# Patient Record
Sex: Male | Born: 1935 | Race: Black or African American | Hispanic: No | Smoking: Former smoker
Health system: Southern US, Community
[De-identification: ages and names within clinical notes are randomized; demographics above are authoritative.]

## PROBLEM LIST (undated history)

## (undated) DIAGNOSIS — M1710 Unilateral primary osteoarthritis, unspecified knee: Secondary | ICD-10-CM

## (undated) DIAGNOSIS — G4733 Obstructive sleep apnea (adult) (pediatric): Secondary | ICD-10-CM

## (undated) DIAGNOSIS — R413 Other amnesia: Secondary | ICD-10-CM

## (undated) DIAGNOSIS — M179 Osteoarthritis of knee, unspecified: Secondary | ICD-10-CM

## (undated) DIAGNOSIS — H9190 Unspecified hearing loss, unspecified ear: Secondary | ICD-10-CM

## (undated) DIAGNOSIS — M4800 Spinal stenosis, site unspecified: Secondary | ICD-10-CM

## (undated) DIAGNOSIS — IMO0001 Reserved for inherently not codable concepts without codable children: Secondary | ICD-10-CM

## (undated) DIAGNOSIS — S0990XA Unspecified injury of head, initial encounter: Secondary | ICD-10-CM

## (undated) DIAGNOSIS — M1990 Unspecified osteoarthritis, unspecified site: Secondary | ICD-10-CM

## (undated) DIAGNOSIS — E1165 Type 2 diabetes mellitus with hyperglycemia: Secondary | ICD-10-CM

## (undated) DIAGNOSIS — I10 Essential (primary) hypertension: Secondary | ICD-10-CM

## (undated) DIAGNOSIS — E785 Hyperlipidemia, unspecified: Secondary | ICD-10-CM

## (undated) DIAGNOSIS — R609 Edema, unspecified: Secondary | ICD-10-CM

## (undated) DIAGNOSIS — B356 Tinea cruris: Secondary | ICD-10-CM

## (undated) HISTORY — DX: Unspecified injury of head, initial encounter: S09.90XA

## (undated) HISTORY — DX: Edema, unspecified: R60.9

## (undated) HISTORY — PX: RETINAL DETACHMENT SURGERY: SHX105

## (undated) HISTORY — DX: Other amnesia: R41.3

## (undated) HISTORY — DX: Tinea cruris: B35.6

## (undated) HISTORY — DX: Obstructive sleep apnea (adult) (pediatric): G47.33

## (undated) HISTORY — DX: Essential (primary) hypertension: I10

## (undated) HISTORY — DX: Spinal stenosis, site unspecified: M48.00

## (undated) HISTORY — DX: Unilateral primary osteoarthritis, unspecified knee: M17.10

## (undated) HISTORY — DX: Reserved for inherently not codable concepts without codable children: IMO0001

## (undated) HISTORY — DX: Type 2 diabetes mellitus with hyperglycemia: E11.65

## (undated) HISTORY — DX: Unspecified hearing loss, unspecified ear: H91.90

## (undated) HISTORY — DX: Osteoarthritis of knee, unspecified: M17.9

## (undated) HISTORY — DX: Unspecified osteoarthritis, unspecified site: M19.90

## (undated) HISTORY — DX: Hyperlipidemia, unspecified: E78.5

---

## 1968-04-13 HISTORY — PX: FACIAL RECONSTRUCTION SURGERY: SHX631

## 1997-10-02 ENCOUNTER — Other Ambulatory Visit: Admission: RE | Admit: 1997-10-02 | Discharge: 1997-10-02 | Payer: Self-pay | Admitting: Internal Medicine

## 1998-03-28 ENCOUNTER — Ambulatory Visit (HOSPITAL_COMMUNITY): Admission: RE | Admit: 1998-03-28 | Discharge: 1998-03-28 | Payer: Self-pay | Admitting: Internal Medicine

## 1998-04-01 ENCOUNTER — Encounter: Payer: Self-pay | Admitting: Internal Medicine

## 1998-04-01 ENCOUNTER — Ambulatory Visit (HOSPITAL_COMMUNITY): Admission: RE | Admit: 1998-04-01 | Discharge: 1998-04-01 | Payer: Self-pay | Admitting: Internal Medicine

## 1999-11-04 ENCOUNTER — Encounter: Payer: Self-pay | Admitting: Internal Medicine

## 1999-11-05 ENCOUNTER — Inpatient Hospital Stay (HOSPITAL_COMMUNITY): Admission: EM | Admit: 1999-11-05 | Discharge: 1999-11-07 | Payer: Self-pay | Admitting: *Deleted

## 1999-11-06 ENCOUNTER — Encounter: Payer: Self-pay | Admitting: Internal Medicine

## 2001-07-26 ENCOUNTER — Encounter: Payer: Self-pay | Admitting: Ophthalmology

## 2001-07-27 ENCOUNTER — Inpatient Hospital Stay (HOSPITAL_COMMUNITY): Admission: RE | Admit: 2001-07-27 | Discharge: 2001-07-29 | Payer: Self-pay | Admitting: Ophthalmology

## 2001-07-28 ENCOUNTER — Encounter: Payer: Self-pay | Admitting: Ophthalmology

## 2002-06-06 ENCOUNTER — Ambulatory Visit (HOSPITAL_COMMUNITY): Admission: RE | Admit: 2002-06-06 | Discharge: 2002-06-06 | Payer: Self-pay | Admitting: Ophthalmology

## 2002-06-16 ENCOUNTER — Ambulatory Visit (HOSPITAL_COMMUNITY): Admission: RE | Admit: 2002-06-16 | Discharge: 2002-06-17 | Payer: Self-pay | Admitting: Ophthalmology

## 2002-06-16 ENCOUNTER — Encounter: Payer: Self-pay | Admitting: Ophthalmology

## 2003-07-20 ENCOUNTER — Ambulatory Visit (HOSPITAL_BASED_OUTPATIENT_CLINIC_OR_DEPARTMENT_OTHER): Admission: RE | Admit: 2003-07-20 | Discharge: 2003-07-20 | Payer: Self-pay | Admitting: Internal Medicine

## 2003-11-09 ENCOUNTER — Encounter: Admission: RE | Admit: 2003-11-09 | Discharge: 2003-11-09 | Payer: Self-pay | Admitting: Internal Medicine

## 2004-02-29 ENCOUNTER — Ambulatory Visit (HOSPITAL_COMMUNITY): Admission: RE | Admit: 2004-02-29 | Discharge: 2004-02-29 | Payer: Self-pay | Admitting: Cardiology

## 2004-03-09 LAB — HM COLONOSCOPY

## 2004-11-12 ENCOUNTER — Ambulatory Visit (HOSPITAL_COMMUNITY): Admission: RE | Admit: 2004-11-12 | Discharge: 2004-11-12 | Payer: Self-pay | Admitting: Pulmonary Disease

## 2005-01-01 ENCOUNTER — Ambulatory Visit (HOSPITAL_COMMUNITY): Admission: RE | Admit: 2005-01-01 | Discharge: 2005-01-01 | Payer: Self-pay | Admitting: Cardiology

## 2005-01-15 ENCOUNTER — Ambulatory Visit (HOSPITAL_COMMUNITY): Admission: RE | Admit: 2005-01-15 | Discharge: 2005-01-15 | Payer: Self-pay | Admitting: Internal Medicine

## 2005-07-14 ENCOUNTER — Encounter: Admission: RE | Admit: 2005-07-14 | Discharge: 2005-07-14 | Payer: Self-pay | Admitting: Internal Medicine

## 2006-03-15 ENCOUNTER — Encounter: Admission: RE | Admit: 2006-03-15 | Discharge: 2006-03-15 | Payer: Self-pay | Admitting: Internal Medicine

## 2006-12-09 ENCOUNTER — Ambulatory Visit: Payer: Self-pay | Admitting: Internal Medicine

## 2006-12-28 ENCOUNTER — Ambulatory Visit (HOSPITAL_BASED_OUTPATIENT_CLINIC_OR_DEPARTMENT_OTHER): Admission: RE | Admit: 2006-12-28 | Discharge: 2006-12-28 | Payer: Self-pay | Admitting: Internal Medicine

## 2007-01-09 ENCOUNTER — Ambulatory Visit: Payer: Self-pay | Admitting: Internal Medicine

## 2007-02-11 ENCOUNTER — Ambulatory Visit: Payer: Self-pay | Admitting: Internal Medicine

## 2007-08-18 ENCOUNTER — Emergency Department (HOSPITAL_COMMUNITY): Admission: EM | Admit: 2007-08-18 | Discharge: 2007-08-18 | Payer: Self-pay | Admitting: Emergency Medicine

## 2007-08-29 ENCOUNTER — Encounter (HOSPITAL_COMMUNITY): Admission: RE | Admit: 2007-08-29 | Discharge: 2007-09-28 | Payer: Self-pay | Admitting: Chiropractic Medicine

## 2007-10-03 ENCOUNTER — Encounter (HOSPITAL_COMMUNITY): Admission: RE | Admit: 2007-10-03 | Discharge: 2007-11-02 | Payer: Self-pay | Admitting: Chiropractic Medicine

## 2008-02-09 DIAGNOSIS — E119 Type 2 diabetes mellitus without complications: Secondary | ICD-10-CM | POA: Insufficient documentation

## 2008-02-09 DIAGNOSIS — G4733 Obstructive sleep apnea (adult) (pediatric): Secondary | ICD-10-CM | POA: Insufficient documentation

## 2008-02-09 DIAGNOSIS — J310 Chronic rhinitis: Secondary | ICD-10-CM | POA: Insufficient documentation

## 2008-02-10 ENCOUNTER — Ambulatory Visit: Payer: Self-pay | Admitting: Internal Medicine

## 2008-02-10 DIAGNOSIS — J4520 Mild intermittent asthma, uncomplicated: Secondary | ICD-10-CM | POA: Insufficient documentation

## 2008-09-04 ENCOUNTER — Encounter (INDEPENDENT_AMBULATORY_CARE_PROVIDER_SITE_OTHER): Payer: Self-pay | Admitting: *Deleted

## 2008-10-25 ENCOUNTER — Telehealth: Payer: Self-pay | Admitting: Internal Medicine

## 2008-12-20 ENCOUNTER — Ambulatory Visit: Payer: Self-pay | Admitting: Internal Medicine

## 2008-12-20 DIAGNOSIS — J029 Acute pharyngitis, unspecified: Secondary | ICD-10-CM | POA: Insufficient documentation

## 2009-02-01 ENCOUNTER — Encounter: Admission: RE | Admit: 2009-02-01 | Discharge: 2009-02-01 | Payer: Self-pay | Admitting: Internal Medicine

## 2009-07-11 ENCOUNTER — Encounter: Admission: RE | Admit: 2009-07-11 | Discharge: 2009-07-11 | Payer: Self-pay | Admitting: Internal Medicine

## 2009-09-02 ENCOUNTER — Encounter: Payer: Self-pay | Admitting: Internal Medicine

## 2009-09-17 ENCOUNTER — Encounter: Payer: Self-pay | Admitting: Internal Medicine

## 2010-02-18 ENCOUNTER — Ambulatory Visit: Payer: Self-pay | Admitting: Internal Medicine

## 2010-05-03 ENCOUNTER — Encounter: Payer: Self-pay | Admitting: Cardiology
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
## 2010-05-13 NOTE — Miscellaneous (Signed)
Summary: Request Medical Records/[facility]  Request Medical Records/[facility]   Imported By: Phillis Knack 09/06/2009 14:26:19  _____________________________________________________________________  External Attachment:    Type:   Image     Comment:   External Document

## 2010-05-13 NOTE — Letter (Signed)
Summary: [facility]  [facility]   Imported By: Bubba Hales 09/20/2009 09:36:41  _____________________________________________________________________  External Attachment:    Type:   Image     Comment:   External Document

## 2010-05-19 ENCOUNTER — Other Ambulatory Visit (HOSPITAL_COMMUNITY): Payer: Self-pay | Admitting: Cardiology

## 2010-05-30 ENCOUNTER — Other Ambulatory Visit: Payer: Self-pay | Admitting: Internal Medicine

## 2010-05-30 DIAGNOSIS — R52 Pain, unspecified: Secondary | ICD-10-CM

## 2010-05-30 DIAGNOSIS — R609 Edema, unspecified: Secondary | ICD-10-CM

## 2010-06-02 ENCOUNTER — Ambulatory Visit
Admission: RE | Admit: 2010-06-02 | Discharge: 2010-06-02 | Disposition: A | Discharge status: Home / Self Care | Payer: BC Managed Care – PPO | Source: Ambulatory Visit | Attending: Internal Medicine | Admitting: Internal Medicine

## 2010-06-02 DIAGNOSIS — R52 Pain, unspecified: Secondary | ICD-10-CM

## 2010-06-02 DIAGNOSIS — R609 Edema, unspecified: Secondary | ICD-10-CM

## 2010-06-06 ENCOUNTER — Ambulatory Visit (HOSPITAL_COMMUNITY): Payer: Self-pay

## 2010-06-06 ENCOUNTER — Ambulatory Visit (HOSPITAL_COMMUNITY)
Admission: RE | Admit: 2010-06-06 | Discharge: 2010-06-06 | Disposition: A | Discharge status: Home / Self Care | Payer: Medicare Other | Source: Ambulatory Visit | Attending: Cardiology | Admitting: Cardiology

## 2010-06-06 DIAGNOSIS — R079 Chest pain, unspecified: Secondary | ICD-10-CM | POA: Insufficient documentation

## 2010-06-06 MED ORDER — TECHNETIUM TC 99M TETROFOSMIN IV KIT: 10.0000 | PACK | Freq: Once | INTRAVENOUS | Status: AC | PRN

## 2010-06-06 MED ORDER — TECHNETIUM TC 99M TETROFOSMIN IV KIT: 30.0000 | PACK | Freq: Once | INTRAVENOUS | Status: AC | PRN

## 2010-07-30 ENCOUNTER — Encounter: Payer: Self-pay | Admitting: Internal Medicine

## 2010-08-26 NOTE — Assessment & Plan Note (Signed)
[facility]                             PULMONARY OFFICE NOTE   NAME:BELLMorgen, Clendenen                         MRN:          [id number]  DATE:02/11/2007                            DOB:          Dec 04, 1934    PROBLEM:  1. Obstructive sleep apnea.  2. Allergic rhinitis (Dr. Donneta Romberg).  3. Diabetes.   HISTORY:  He had not been tolerating CPAP with primary complaint being  his wife's frustration with his snoring.  He returns now after his  latest sleep study on December 28, 2006 showing unremarkable sleep  architecture and minimal sleep apnea with an index of 5.4 (normal range  is 0-5).  He did have some bruxism.  His original study around 1992 had  shown an index of 50 per hour and the repeat in 2005 had shown an index  of 47, titrated to a CPAP of 9 at that time.  It is not clear why he  demonstrated so little apnea by comparison on the current study.  If  anything he has gained some weight but thinks he may be breathing better  through his nose.  He has disliked CPAP because it caused rapid nasal  congestion consistent with a vasomotor rhinitis and that may vary from  study to study.  Has had flu vaccine.   MEDICATION LIST:  Reviewed as charted.   OBJECTIVE:  Weight 233 pounds, blood pressure 136/64, pulse 83, room air  saturation 98%.  Palatal spacing 3/4, nasal airway is not obstructed  now.  Heart sounds are regular, breathing unlabored.  He seems calm and  alert.   IMPRESSION:  Obstructive sleep apnea now minimal with an index of 5.4,  prior index 50 and 47.   PLAN:  I have suggested he try conservative measures to manage snoring  including sleeping off the flat of his back, nasal strips, chin strap,  possible occasional use of decongestants and he is going to have his  CPAP machine reduced from 9 to 5 CWP since that will likely take care of  the snoring and be better tolerated by him.  Schedule return in 1 year,  earlier p.r.n.     Clinton  D. Annamaria Boots, MD, [place] Flood, [place]  Electronically Signed    CDY/MedQ  DD: 02/13/2007  DT: 02/14/2007  Job #: [id number]   cc:   Randall Hiss L. Marlou Sa, M.D.  Mosetta Anis, M.D. [facility]

## 2010-08-26 NOTE — Procedures (Signed)
NAMESAKETH, [name]                  ACCOUNT NO.:  [id number]   MEDICAL RECORD NO.:  [id number]          PATIENT TYPE:  OUT   LOCATION:  SLEEP CENTER                 FACILITY:  [facility]   PHYSICIAN:  Clinton D. Annamaria Boots, MD, FCCP, FACPDATE OF BIRTH:  02-18-37   DATE OF STUDY:  12/28/2006                            NOCTURNAL POLYSOMNOGRAM   REFERRING PHYSICIAN:  Clinton D. Young, MD, FCCP, FACP   INDICATION FOR STUDY:  Hypersomnia with sleep apnea.   EPWORTH SLEEPINESS SCORE:  6/24, BMI 27.9, weight 225 pounds.   MEDICATIONS:  Home medications are listed and reviewed.   SLEEP ARCHITECTURE:  Total sleep time 334 minutes with sleep efficiency  85%.  Stage 1 was 5%, stage II 74%, stage III is 1%, REM 20% of total  sleep time.  Sleep latency 4 minutes, REM latency 80 minutes, awake  after sleep onset 56 minutes, arousal index 14.7.  Ramipril, Singulair,  Crestor, Uroxatral, pentoxifylline and Actoplus Met were taken at  bedtime.   RESPIRATORY DATA:  Apnea-hypopnea index (AHI, RDI) 5.4 obstructive  events per hour (normal range 0-5 per hour), indicating minimal  obstructive sleep apnea/hypopnea syndrome.  This reflected one central  apnea and 29 hypopneas.  Most events were recorded while sleeping  supine.  There were insufficient events to permit CPAP titration by  split protocol on this study night.   OXYGEN DATA:  Moderately loud snoring with oxygen desaturation to a  nadir of 90%.  Mean oxygen saturation through the study was 94% on room  air.   CARDIAC DATA:  Sinus rhythm with occasional PVC and PAC.   MOVEMENT-PARASOMNIA:  No significant movement disturbance.  Bruxism.  Bathroom x2.   IMPRESSIONS-RECOMMENDATIONS:  1. Unremarkable sleep architecture for sleep center environment.  2. Minimal obstructive sleep apnea/hypopnea syndrome, barely abnormal,      apnea-hypopnea index 5.4 per hour      (normal range 0 to 5 per hour).  Moderate snoring with oxygen      desaturation nadir  90%.  Events were somewhat more common while      supine.  Bruxism was noted.      Clinton D. Annamaria Boots, MD, FCCP, FACP  Diplomate, Tax adviser of Sleep Medicine  Electronically Signed     CDY/MEDQ  D:  01/09/2007 10:01:08  T:  01/09/2007 12:45:25  Job:  [id number]

## 2010-08-26 NOTE — Assessment & Plan Note (Signed)
[facility]                             PULMONARY OFFICE NOTE   NAME:BELLFatih, Serratore                         MRN:          [id number]  DATE:12/09/2006                            DOB:          09-19-1934    PROBLEM LIST:  1. Obstructive sleep apnea.  2. Allergic rhinitis (Dr. Donneta Romberg).  3. Diabetes.   HISTORY:  I had followed this gentleman at the old office where he was  last seen in April 2005 for problems with obstructive sleep apnea.  He  had a sleep study on July 20, 2003, with an index of 47 per hour.  His  original diagnosis in 1992 had shown 50 per hour.  He had oxygen  desaturation and loud snoring.  CPAP was titrated to 9 CWP but had never  been particularly comfortable with it.  He says now that he quit CPAP  several months ago, blaming it for increasing sinus congestion and  saying he had been taking the mask off at night because of sneezing.  His wife had been a smoker, and he wanted to get an air cleaner for the  bedroom because of that.  I do not know if she still does, but she  complains that his snoring off of CPAP was much worse.  He has been  trying to sleep off his back.  He denies significant nasal congestion  when he is not wearing CPAP.  He has been using Astelin nasal spray in  the mornings.   MEDICATIONS:  1. Ramipril 5 mg.  2. Avodart 0.5 mg.  3. Singulair 10 mg.  4. Crestor 10 mg.  5. Uroxatral 10 mg.  6. Pentoxifylline 400 mg 3 times a day.  7. ACTOplus MET 15-850.  8. Januvia 10 mg.  9. Advair 250/50.  10.Astelin nasal spray.  11.Aspirin 81 mg.  12.Claritin.  13.Occasional alprazolam 0.5 mg.   DRUG INTOLERANCE:  SULFA.   OBJECTIVE:  VITAL SIGNS:  Weight 225 pounds which is about the same as  he had been weighing.  Blood pressure 122/70, pulse 79, room air  saturation 99%.  HEENT:  Turbinate edema.  Dental repair with partial plates.  GENERAL:  He yawns some, calm affect.   IMPRESSION:  Obstructive sleep  apnea, not tolerating CPAP very well  still.  He has allergic rhinitis and may be sensitive to the vasomotor  stimulation from the air flow despite humidifier.  His partial plates  make him a poor candidate for an oral appliance, and his throat is not  so bad as to make it likely that surgical repair would make a big  difference.   PLAN:  We reviewed the physiology of sleep apnea and treatment options  again with the decision to go forward with a repeat sleep study to  reestablish his current status and relook at best pressures using split  protocol.  He will return after that is completed.     Clinton D. Annamaria Boots, MD, [facility], FACP  Electronically Signed    CDY/MedQ  DD: 12/13/2006  DT: 12/13/2006  Job #: [id number]  cc:   Eric L. Marlou Sa, M.D.  Mosetta Anis, M.D. [facility]  [facility]

## 2010-08-29 NOTE — Op Note (Signed)
[facility]  Patient:    [name], [name] Visit Number: [id number] MRN: [id number]          Service Type: SUR Location: [id number] Attending Physician:  Charlette Caffey Dictated by:   Garey Ham, M.D. Proc. Date: 07/26/01 Admit Date:  07/26/2001   CC:         Randall Hiss L. Marlou Sa, M.D.  Marcene Corning., M.D.   Operative Report  PREOPERATIVE DIAGNOSIS:  Vitreous hemorrhage and rhegmatogenous retinal detachment, left eye.  POSTOPERATIVE DIAGNOSIS:  Vitreous hemorrhage and rhegmatogenous retinal detachment, left eye.  OPERATION:  Posterior vitrectomy through pars plana using vitreous infusion suction cutter, scleral buckling using solid silicone implants [id number] and [id number], diathermy application, vitreous-Perfluoron exchange, Perfluoron-air exchange.  SURGEON:  Garey Ham, M.D.  ASSISTANT:  Nurse.  ANESTHESIA:  General.  OPHTHALMOSCOPY:  As previously described.  DESCRIPTION OF PROCEDURE:  After the patient was prepped and draped, lid traction sutures were placed in the left upper and lower lids.  A peritomy was performed adjacent to the limbus 360 degrees.  Subconjunctival tissue was cleaned and the rectus muscles isolated with 4-0 silk traction sutures.  The sclera was inspected and felt to be in satisfactory condition for lamellar scleral dissection.  Localization was then carried out with the indirect ophthalmoscope and the large horseshoe tear noted at the 12:30 position.  No other tears were seen on indirect ophthalmoscopy and scleral depression.  It was elected to perform lamellar scleral dissection from the 10:30 to 2:30 position, the bed measuring 11 mm in width.  Diathermy applications were applied to the inner scleral lamella.  A trimmed 123456 solid silicone implant was placed in the bed and the flaps closed loosely with four 4-0 green Mersilene sutures with 3-0 plain catgut reinforcements.  A 123456 solid  silicone encircling band was placed about the globe, tied with two sutures at the 5 oclock position; anchoring sutures were placed at the 5 and 8 oclock position to hold the encircling band in place.  Due to the dense vitreous hemorrhage, it was elected to assemble the vitreous surgery instruments and ocular microscope.  Three sclerotomy sites were then prepared at the 10, 2 and 4 oclock position, 3.5 mm from the limbus using the MVR blade.  The 4-mm vitreous infusion terminal was secured in place at the 4 oclock position with a 5-0 white mattress Dacron suture.  The tip could be seen projecting into the vitreous cavity.  The fiberoptic lightpipe was inserted at the 2 oclock position and the handpiece of the vitreous infusion suction cutter at the 10 oclock position.  Slow vitreous infusion suction/cutting were begun from an anterior-to-posterior direction.  There was dense vitreous hemorrhage present.  The large horseshoe retinal tear at the 12:30 position was visualized and attempt was made to cut any traction on the tear flap.  The vitreous slowly cleared.  There was some scleral depression inferiorly to remove and to bring into focus the dense vitreous hemorrhage inferiorly.  No other retinal tears were noted.  It was then elected to inject Perfluoron into the vitreous cavity, filling it; this was in exchange of the vitreous fluid. Then a Perfluoron-air exchange was performed.  The scleral flaps were pulled up creating a good smooth scleral buckling indentation superiorly.  The tear appeared to be in satisfactory position on the implant.  Visualization, however, was difficult due to the combination of the small residual blood and air.  It was felt that the  retina was reattached and it was then elected to withdraw the vitreous instruments.  The sclerotomy sites were closed with 7-0 Vicryl interrupted sutures.  The scleral flaps on the buckling were permanently secured and the tension of  the encircling band adjusted. Intraocular pressure was left at 20 to 25 mm.  Tenons capsule was then pulled forward and closed with a separate 6-0 chromic catgut suture in each of the four quadrants.  The conjunctiva was then pulled forward and closed as a separate layer with a running 6-0 chromic catgut suture.  Depo-Garamycin and Celestone were injected in the subtenon space inferiorly.  Neosporin ophthalmic solution had been used during the latter portion of the procedure to irrigate the subtenon space.  Maxitrol and atropine ointment were instilled in the conjunctival cul-de-sac and a light patch and protective shield applied.  Duration of procedure and anesthesia -- two and one-half hours. Patient tolerated the procedure well and left the operating room for the recovery room in good condition. Dictated by:   Garey Ham, M.D. Attending Physician:  Charlette Caffey DD:  07/27/01 TD:  07/27/01 Job: [id number]

## 2010-08-29 NOTE — Op Note (Signed)
NAMEJAKE, [name]                              ACCOUNT NO.:  [id number]   MEDICAL RECORD NO.:  [id number]                   PATIENT TYPE:  OIB   LOCATION:  2899                                 FACILITY:  [place]   PHYSICIAN:  Garey Ham, M.D.             DATE OF BIRTH:  1934/08/15   DATE OF PROCEDURE:  06/06/2002  DATE OF DISCHARGE:  06/06/2002                                 OPERATIVE REPORT   PREOPERATIVE DIAGNOSIS:  Nuclear cataract left eye, status post vitrectomy  for retinal detachment, vitrectomy scleral buckling for retinal detachment  left eye.   SURGEON:  Garey Ham, M.D.   ASSISTANT:  Nurse.   ANESTHESIA:  Local 4% Xylocaine, 0.75 Marcaine, topical tetracaine, and  intraocular Xylocaine.  Anesthesia standby required in this elderly patient.   DESCRIPTION OF PROCEDURE:  After the patient was prepped and draped, a lid  speculum was inserted in the left eye.  The eye was turned downward and a  superior rectus traction suture placed.  Schiotz tonometry was performed and  felt to be within normal scale readings.  A peritomy was performed adjacent  to the limbus from the 11 to 1 o'clock position. The corneoscleral junction  was clean and a corneoscleral groove made with a 45 degree Superblade.  The  anterior chamber was then entered with the 2.5 mm diamond keratome at the 12  o'clock position and the 15 degree blade at the 2:30 position.   Using a bent 26 gauge needle on a Healon syringe, a circular capsulorhexis  was begun and then completed the Grabow forceps.  Hydrodissection and  hydrodelineation were performed using 1% Xylocaine.  The 30 degree  phacoemulsification tip was then inserted with slow controlled  emulsification of the lens nucleus.  Total ultrasonic time 1 minute 42  seconds.  Average power level 21%.  Total amount of fluid used 70 cc.   After the nucleus had been emulsified and aspirated, the residual cortex was  aspirated.  There appeared  to be a sudden round and central hole in the  posterior capsule indicating a posterior capsule break.  The peripheral  capsule appeared intact and it was elected to insert a posterior chamber  intraocular lens implant without further manipulation.  A Storz EZE-60 power  +15.50, length 12.75, optic 6.0, serial number [id number] was inserted.  A  peripheral iridectomy was performed at the 11 o'clock position.  The corneal  incision had been widened to 6 mm to accommodate this implant.  The implant  appeared to be well centered.  The corneal incision superiorly was then  closed with five 10-0 interrupted nylon sutures.  Maxitrol ointment was  instilled in the conjunctival cul-de-sac and a light patch and protector  shield applied.  Duration of the procedure and anesthesia administration 45  minutes.  The patient tolerated the procedure well in general and left the  operating  room for the recovery room in good condition.                                               Garey Ham, M.D.     HNJ/MEDQ  D:  07/30/2002  T:  07/30/2002  Job:  [id number]

## 2010-08-29 NOTE — H&P (Signed)
[place]. [facility]  Patient:    Wall Wall Visit Number: [id number] MRN: [id number]          Service Type: SUR Location: [id number] Attending Physician:  Wall Wall Dictated by:   Wall Wall, M.D. Admit Date:  07/26/2001   CC:         Wall Wall, M.D.  Wall Wall., M.D.   History and Physical  REASON FOR ADMISSION:  This was a planned outpatient surgical admission of this 76 year old black male admitted with a dense vitreous hemorrhage of the left eye and rhegmatogenous retinal detachment.  PRESENT ILLNESS:  This patient has a history of non-insulin-dependent diabetes mellitus for the past one to two years.  The patient has been followed by Dr. Randall Hiss L. Wall for this and has been on oral medication consisting of Avandia and Glucophage.  The patient also has been taking Aspirin 81 mg a day and Trental for chronic leg cramps.  The patient suddenly developed a loss of vision with floaters and dimness of the left eye at the end of March 2003.  He was seen by Dr. Marylynn Wall, Wall Wall. and found to have a dense vitreous hemorrhage.  The patient was referred to my office on July 12, 2001. Examination at that time revealed a visual acuity of 20/20 in the right eye and finger-counting in the left eye.  The presence of a dense vitreous hemorrhage was confirmed.  B scan ultrasonography did not show any retinal detachment present.  An area of lattice retinal degeneration was noted at the 12 oclock position of the right eye.  It was felt that the patient had a vitreous hemorrhage either in relation to his diabetes mellitus or due to a retinal tear that could not be seen.  The patient was told to discontinue his aspirin and Trental.  He returned to the office on July 15, 2001 and vision still remained less than 20/400.  B scan on July 15, 2001 and July 18, 2001 did not reveal any retinal detachment.  When the patient returned to  the office on July 25, 2001, he stated that during the past two to three days, he had noted the onset of a shadow-like sensation in the left eye which became progressively worse; he had felt previously that his vision had been slowly clearing.  Examination revealed a continued dense vitreous hemorrhage but a retinal detachment now was present.  Arrangements were made for his outpatient surgery at this time.  PAST MEDICAL HISTORY:  The patient, as noted above, continues under the care of Dr. Kevan Wall, taking Clarinex, Singulair, Avandia, Flomax, Glucophage and Advair inhalers.  The patient has sleep apnea and uses a positive pressure machine while sleeping at night.  ALLERGIES:  The patient has some allergies to SULFA and SEASONAL POLLENS.   REVIEW OF SYSTEMS:  No chest pain, shortness of breath or ankle edema.  No history of heart disease.  PHYSICAL EXAMINATION:  VITAL SIGNS:  As recorded on admission, blood pressure 119/76, pulse 73, respirations 16, temperature 97.4.  GENERAL APPEARANCE:  The patient is an alert, well-nourished, well-developed black male in acute ocular distress.  HEENT:  Eyes:  Visual acuity 20/20 -- right eye, less than 20/400 -- left eye, finger-counting in superior visual field only.  Applanation tonometry 14 mm -- right eye, 8 -- left eye.   External ocular and slitlamp examination normal. Detailed fundus examination:  Left eye reveals an almost total rhegmatogenous retinal  detachment.  Visualization is extremely difficult due to the dense vitreous hemorrhage present.  There appears to be a large retinal tear at the 12:30 position.  CHEST:  Lungs clear to percussion and auscultation.  HEART:  Normal sinus rhythm.  No cardiomegaly.  No murmurs.  ABDOMEN:  Negative.  EXTREMITIES:  Negative.  ADMISSION DIAGNOSES: 1. Rhegmatogenous retinal detachment, left eye. 2. Pseudophakia, left eye. 3. Lattice retinal degeneration, right eye.  SURGICAL PLAN:   Posterior vitrectomy combined with scleral buckling procedure. The patient has been given oral discussion and printed information concerning retinal detachment surgery.  The patient was told that this was a complicated retinal detachment and would require a combination of vitrectomy and scleral buckling.  Multiple operations and failure to reattach the retina were discussed with the patient.  The patient elected to proceed with surgery as outlined. Dictated by:   Wall Wall, M.D. Attending Physician:  Wall Wall DD:  07/27/01 TD:  07/27/01 Job: [id number]

## 2010-08-29 NOTE — Discharge Summary (Signed)
[place]. [facility]  Patient:    [name], [name] Visit Number: [id number] MRN: [id number]          Service Type: MED Location: [phone] Attending Physician:  Charlette Caffey Dictated by:   Garey Ham, M.D. Admit Date:  07/26/2001 Discharge Date: 07/29/2001                             Discharge Summary  REASON FOR HOSPITALIZATION:  This was a planned outpatient surgical admission of this 76 year old black male admitted with a chronic vitreous hemorrhage and retinal tear and retinal detachment.  HOSPITAL COURSE:  The patient was evaluated preoperatively and felt to be in satisfactory condition for the proposed surgery.  The patient was taken to the operating room where a complex combination posterior vitrectomy and scleral buckling procedure were performed under general anesthesia.  Duration of procedure 1-1/2 hours to 2 hours.  The patient tolerated the procedure well and was taken to the recovery room and subsequently to the 23-hour observation unit.  Postoperatively, the patient complained of some vague chest pain and/or discomfort.  The patient was transferred to a telemetry bed and followed closely by his regular physician, Dr. Kevan Ny.  Chest x-rays, cardiac enzymes and cardiac consultation was obtained.  After evaluating the patient and keeping him at bed rest it was felt that this pain probably was noncardiac in etiology possibly related to esophageal reflux action.  The patient was felt to have achieved hospital benefit and was therefore discharged home to be followed in the office.  During the hospitalization the operated eye was difficult to see into due to a 3 mm pupil which did not dilate well.  The vitreous cavity was filled with air and the retina appeared attached where seen.  The patient was given a printed list of discharge instructions on the care and use of the operated eye.  Discharge ocular medications  included Tobradex and Cyclomydril ophthalmic solutions one drop four times a day and a Maxitrol and atropine ointment at bedtime.  The patient was to continue with partial face down positioning and lay on his right side.  CONDITION ON DISCHARGE:  Improved.  DISCHARGE DIAGNOSES:  __________ retinal detachment single defect, vitreous hemorrhage, chronic open angle glaucoma.  SECONDARY DIAGNOSIS:  Chest pain, undetermined etiology.  MEDICATIONS:  The patient to resume usual glaucoma medications from home; Xalatan ophthalmic solution one drop to both eyes.  FOLLOWUP APPOINTMENT:  Dr. Venetia Maxon for glaucoma control, Dr. Ishmael Holter in 24 hours for review of retinal status.  The patient is to continue under the care of Dr. Marlou Sa for any chest pain or discomfort and keep his regular appointments with him.  CONDITION ON DISCHARGE:  Improved. Dictated by:   Garey Ham, M.D. Attending Physician:  Charlette Caffey DD:  07/29/01 TD:  07/30/01 Job: [id number]

## 2010-08-29 NOTE — H&P (Signed)
[name], [name]                              ACCOUNT NO.:  [id number]   MEDICAL RECORD NO.:  [id number]                   PATIENT TYPE:  OIB   LOCATION:  [id number]                                 FACILITY:  [facility]   PHYSICIAN:  Garey Ham, M.D.             DATE OF BIRTH:  May 22, 1934   DATE OF ADMISSION:  06/16/2002  DATE OF DISCHARGE:  06/17/2002                                HISTORY & PHYSICAL   REASON FOR ADMISSION:  This was a planned outpatient readmission of this 76-  year-old black male admitted for removal of retained cataract/dislocated  cataract fragments left eye.   HISTORY OF PRESENT ILLNESS:  This patient has a complex ocular history with  a previous scleral buckling posterior vitrectomy for complex retinal  detachment on July 26, 2001.  Postoperatively, the patient developed a  gradual dense nuclear cataract and the patient was admitted on June 06, 2002 for cataract implant surgery of the left eye.  At surgery, the  posterior capsule was broken and there were retained lens fragments  postoperatively.  These have not absorbed and it now elected to remove them  with vitrectomy surgery.  The patient was given oral discussion and printed  information concerning the procedure and its complications once again.  He  signed an informed consent and arrangements were made for his outpatient  admission (see detailed previous history, physical, and operative note).   PAST MEDICAL HISTORY:  See old chart.  The patient's general health is  stable and he has no acute health problems.   REVIEW OF SYMPTOMS:  No cardiorespiratory complaints.   PHYSICAL EXAMINATION:  VITAL SIGNS:  As recorded on admission, blood  pressure 121/75, respirations 24, heart rate 87, temperature 97.2.  GENERAL:  The patient is a pleasant well-developed, well-nourished black  male in no acute distress.  HEENT:  Eyes show visual acuity 20/20 right eye, finger counting left eye.  Applanation  tonometry 18 mm right eye and 22 left eye.  External ocular and  slit lamp examination right eye normal.  Left eye is white and clear with a  clear corneal, deep and clear anterior chamber.  A peripheral iridectomy is  present at the 11 o'clock position.  The pupil is dilated. A well-centered  posterior chamber intraocular lens implant is present.  There are cortical  cataract fragments remaining in the anterior chamber and in the peripheral  posterior capsule which has a central opening.  Detail fundus examination  reveals some residual cataract fragments in the vitreous.  CHEST:  Clear to auscultation and percussion.  HEART:  Normal sinus rhythm.  No cardiomegaly and no murmurs.  ABDOMEN:  Negative.  EXTREMITIES:  Negative.   ADMISSION DIAGNOSIS:  Retained cataract fragments following cataract  surgery, status post previous posterior vitrectomy scleral buckling  procedure left eye.   PLAN:  Pars plana vitrectomy to remove anterior chamber and posterior  chamber retained cataract fragments and dislocated cataract fragments.                                              Garey Ham, M.D.   HNJ/MEDQ  D:  07/30/2002  T:  07/30/2002  Job:  [id number]

## 2010-08-29 NOTE — Discharge Summary (Signed)
[place]. [facility]  Patient:    [name], [name]                           MRN: [id number] Adm. Date:  [id number] Disc. Date: [id number] Attending:  Rogers Blocker                           Discharge Summary  FINAL DIAGNOSES: 1. Septicemia with negative blood cultures. 2. Prostatitis. 3. Diabetes mellitus. 4. Asthma. 5. Allergic rhinitis. 6. History of mild memory impairment. 7. Dehydration.  OPERATIONS/PROCEDURES:  None.  HISTORY OF PRESENT ILLNESS:  This was the first recent [facility] admission for this 76 year old married black male with longstanding history of hypertension and diabetes mellitus, who was doing well until 24 hours prior to admission.  At that time, he developed onset of lower abdominal pain associated with crampy sensation.  He later noted lower back pain which was dull, aching in nature.  There was no associate dysuria.  His symptoms persisted during the night.  He did note occasional chill.  No associated cough.  The subsequent morning, he had diffuse arthralgias with a mild headache, low back, persistent, with a decreased appetite.  The patient came to the office for further evaluation.  He was noted to have a fever at that time of 100.3 with tachycardia and mild hypotension, with evidence for dehydration.  Patient was transported to [facility] for further evaluation.  PAST MEDICAL HISTORY AND PHYSICAL EXAMINATION:  As per admission H&P.  HOSPITAL COURSE:  Patient was admitted for further evaluation of increasing fever, low back pain, and weakness.  Notably, at the time of admission, his temperature had risen to 101.8.  He was still tachycardic.  Blood cultures were obtained x 2.  Patient was started on empiric antibiotic therapy with Tequin 400 mg q.d.  He was also placed on IV fluids for rehydration.  His temperature was controlled with Tylenol.  He was placed on a sliding scale insulin regimen, as well.  Over the  subsequent 24 hours, he made a gradual improvement.  His temperatures did gradually defervesce.  A chest x-ray was obtained in follow-up.  There was no evidence for pneumonia.  Blood cultures were subsequently negative x 2.  Rectal exam was performed, which demonstrated an enlarged prostate.  No definite nodules were appreciated.  An abdominal ultrasound was obtained, which also confirmed an enlarged prostate without evidence for obstruction.  With continued antibiotic therapy, the patients temperature did gradually defervesce.  With this, his tachycardia gradually subsided and his blood pressure remained stable.  Patients picture was most consistent with a transient septicemia, though his cultures remained negative.  By November 07, 1999, he was feeling much better.  He was made more ambulatory. His mental status was improved.  He was felt to be stable for discharge.  Notably, patient on evaluation during this hospital stay had a borderline EKG with nonspecific ST-T wave changes.  Enzymes, however, were negative.  Plans are for patient to undergo an outpatient stress thallium test.  DISPOSITION:  He was subsequently discharged home improved.  MEDICATIONS: 1. Tequin 400 mg p.o. q.d. for seven days. 2. Trental 400 mg b.i.d. 3. Zyrtec 10 mg p.o. q.h.s. 4. Xanax 0.25 mg t.i.d. 5. Cardura 4 mg one-half tablet at bedtime. 6. Glucophage XR 500 mg two q.a.m. 7. Enteric-coated aspirin 82 mg q.d.  DIET:  He is to  avoid any concentrated sweets.  FOLLOW-UP:  He is to have a follow-up appointment in 10 days time. DD:  11/29/99 TD:  12/01/99 Job: [id number]

## 2010-08-29 NOTE — Op Note (Signed)
NAMEMELVA, [name]                              ACCOUNT NO.:  [id number]   MEDICAL RECORD NO.:  [id number]                   PATIENT TYPE:  OIB   LOCATION:  2899                                 FACILITY:  [place]   PHYSICIAN:  Garey Ham, M.D.             DATE OF BIRTH:  07/05/1934   DATE OF PROCEDURE:  06/06/2002  DATE OF DISCHARGE:  06/06/2002                                 OPERATIVE REPORT   PREOPERATIVE DIAGNOSIS:  Senile nuclear and posterior subcapsular cataract,  left eye, status post scleral buckling and posterior vitrectomy surgery left  eye.   POSTOPERATIVE DIAGNOSIS:  Senile nuclear and posterior subcapsular cataract,  left eye, status post scleral buckling and posterior vitrectomy surgery left  eye.   SURGEON:  Garey Ham, M.D.   ASSISTANT:  Nurse.   ANESTHESIA:  Local with 0.4% Xylocaine and 0.75 mg of Marcaine, retrobulbar  block, topical Tetracaine, intraocular Xylocaine.  Anesthesia standby  required.  The patient was given sodium Pentothal or Ditropan intravenously  during the period of retrobulbar blocking.   OPERATIVE PROCEDURE:  After the patient was prepped and draped a lid  speculum was inserted in the left eye.  Schiotz tonometry was recorded as 8  to 10 scale units with a 5.5 gram weight.  A peritomy was performed adjacent  to the limbus from the 11 to 1 o'clock position. This was later extended to  the 10 to 2 o'clock position. The corneoscleral junction was cleaned and a  corneoscleral groove made 3 mm in length with the 45 degree Superblade at  the 12 o'clock position. The anterior chamber was then entered with the 2.5  mm diamond keratome at the 12 o'clock position and the 15 degree blade at  the 2:30 position.  Using a Bent 26 gauge needle on a Healon syringe a  circular capsular axis was begun and then completed with the Grabow forceps.  Hydrodissection and hydrodelineation were performed using 1% Xylocaine.  A  fluid wave could be  seen extending across the posterior surface of the  nucleus.  A 30 degree phakoemulsification tip was then inserted.  Slow  phakoemulsification was begun with central grooving and sculpting.  The lens  nucleus was turned in the capsular bag and appeared to be free.  As  continuing sculpting began, it was apparent that the nucleus had shifted  inferiorly and there appeared to be a small hole in the posterior capsule.  It was felt that the nucleus should be immediately removed.  The incision  was extended to 7 mm in length. The lens loop was then inserted and the  nucleus brought forward and removed in one piece.  Interrupted 7-0 Vicryl  sutures were then used to close the incision which had been lengthened to  approximately 7 mm in length.  There was moderate cortical debris in the  anterior chamber after Healon had been  placed to deepen the chamber.  The  irrigation aspiration tip was then inserted with removal of the multiple  floating fragments of cortex.  Inspection of the capsule revealed a  completely circular posterior capsule opacity slightly eccentric measuring  approximately 4 x 2 mm in length.  It was felt that there was enough  posterior capsule present to provide support for a posterior chamber  intraocular lens implant.  Initially it had been planned to use a silicon  lens 123456.  This, however, was not inserted and it was elected in this  vitrectomy patient to use a polymethylmethacrylate lens, model EZE-60 power  +15.50, length 12.5, optic 6.00.  This was inserted into the anterior  chamber and then centered into the ciliary sulcus with the Shepherd forceps  and McPherson forceps.  The lens appeared to center well.  It was then  elected to close.  The Healon which had been used throughout the procedure  was aspirated and replaced with balanced salt solution.  A peripheral  iridectomy was performed at the 11 o'clock position.  There appeared to be  some residual cortex left.   It was felt, however, hazardous to continue  aspiration in view of the well centered posterior chamber lens.  It was felt  that this could spontaneously absorb in the postoperative period or that  secondary suture might later be considered if necessary.  The 7-0 Vicryl  sutures were replaced with 10-0 Vicryl sutures to close the corneoscleral  incision.  Maxitrol ointment was instilled in the conjunctival cul-de-sac  and a light patch and protector shield applied.  Duration of procedure and  anesthesia administration was one hour.  The patient tolerated the procedure  well in general, left the operating room for the recovery room in good  condition.                                               Garey Ham, M.D.    HNJ/MEDQ  D:  06/09/2002  T:  06/09/2002  Job:  [id number]   cc:   Randall Hiss L. Marlou Sa, M.D.  [address] [place] [address]  Fax: [phone]

## 2010-08-29 NOTE — Discharge Summary (Signed)
   NAMEKAELOB, TWIGGS                              ACCOUNT NO.:  [id number]   MEDICAL RECORD NO.:  [id number]                   PATIENT TYPE:  OIB   LOCATION:  [id number]                                 FACILITY:  [place]   PHYSICIAN:  Garey Ham, M.D.             DATE OF BIRTH:  04/11/1935   DATE OF ADMISSION:  06/16/2002  DATE OF DISCHARGE:  06/17/2002                                 DISCHARGE SUMMARY   HISTORY OF PRESENT ILLNESS:  This was a planned outpatient readmission of  this 76 year old white male admitted for dislocated lens fragments following  cataract surgery (see detailed admission history and physical).   HOSPITAL COURSE:  The patient was felt to be in satisfactory condition for  the proposed surgery.  He therefore was taken into the operating room where  under general anesthesia a posterior vitrectomy was performed with the  aspiration and removal of multiple large cortex fragments and blood on the  surface of the retina and on the peripheral posterior side of the posterior  chamber intraocular lens implant.  The patient tolerated the procedure well  under general anesthesia and was taken to the recovery room and subsequently  to the 23 hour observation unit.  The patient had a past history of acute  urinary retention due to prostate disorder, and a Foley catheter was placed  at the end of the surgery.  The patient had the catheter removed the  following morning and had been given Flomax on the evening of surgery and  the morning of discharge.  He was therefore discharged home after voiding  spontaneously.  Examination of the eye with a slit lamp examination reveals  slight corneal edema, a deep anterior chamber and clear chamber.  There were  a few cortex fragments on the front surface of the implant which were note  removed as they were felt small enough to absorb spontaneously.  Applanation  tonometry 21 mm.  Inspection of the fundus on the evening of surgery  revealed  the vitreous to be clear, the retina attached with no posterior  fragments of the lens material.  The patient was discharged home to be  followed in the office in 24 hours.  The patient was given a list of  discharge instructions on the care and use of the operated eye.   DISCHARGE OCULAR MEDICATIONS:  1. TobraDex and Cyclomydril ophthalmic solutions, one drop four times a day.  2. Atropine and Maxitrol ointment at bedtime.                                                 Garey Ham, M.D.    HNJ/MEDQ  D:  06/18/2002  T:  06/19/2002  Job:  [id number]

## 2010-08-29 NOTE — H&P (Signed)
[name], [name]                              ACCOUNT NO.:  [id number]   MEDICAL RECORD NO.:  [id number]                   PATIENT TYPE:  OIB   LOCATION:  2899                                 FACILITY:  [facility]   PHYSICIAN:  Garey Ham, M.D.             DATE OF BIRTH:  [birth year]   DATE OF ADMISSION:  06/06/2002  DATE OF DISCHARGE:  06/06/2002                                HISTORY & PHYSICAL   REASON FOR ADMISSION:  This was a planned outpatient readmission of this 76-  year-old black male admitted for cataract implant surgery of the left eye.   HISTORY OF PRESENT ILLNESS:  This patient has a complex history of  noninsulin-dependent diabetes mellitus and the onset of a rhegmatogenous  retinal detachment of the left eye.  The patient was previously admitted on  July 27, 2002 and underwent a complex scleral buckling posterior vitrectomy  operation under general anesthesia.  The patient did well following this,  although was troubled with a postoperative retinal fold which was located  adjacent to his macula.  Visual acuity improved but was not excellent.  The  patient gradually developed nuclear cataract formation in the operated left  eye further reducing his vision to 20/400.  It was therefore elected to  proceed with cataract implant surgery at this time.   PAST MEDICAL HISTORY:  The patient is in stable general health.  Dr. Marlou Sa is  his family physician.   PHYSICAL EXAMINATION:  VITAL SIGNS:  As recorded on admission, blood  pressure 117/64, temperature 97.7, pulse 62, respirations 20.  GENERAL:  The patient is a pleasant well-developed, well-nourished black  male in no acute distress.  HEENT:  Eyes:  Visual acuity is 20/20 over right eye with correction and  20/200 to 20/400 left eye with correction.  Applanation tonometry is 18 mm  each eye.  External ocular and slit lamp examination shows the eyes are  white and clear with a dense nuclear cataract in the left eye.   Detailed  fundus examination left eye reveals cataract haze.  The vitreous is clear.  The retina is attached with a good superior buckling indentation.  The optic  nerve is sharply outlined of good color.  Disk cup ratio 0.4.  The blood  vessels appear normal. The macula is flat, although there is a small linear  fold which is difficult to see which extends just adjacent to the macula.  It appears to be decreasing in height as time goes by.  CHEST:  Lungs clear to auscultation and percussion.  HEART:  Normal sinus rhythm.  No cardiomegaly and no murmurs.  ABDOMEN:  Negative.  EXTREMITIES:  Negative.   ADMISSION DIAGNOSES:  Senile cataract left eye, status post vitrectomy  scleral buckling surgery for retinal detachment left eye.   PLAN:  Cataract implant surgery left eye.  Garey Ham, M.D.   HNJ/MEDQ  D:  07/30/2002  T:  07/30/2002  Job:  [id number]   cc:   Marlou Sa, M.D.

## 2010-08-29 NOTE — H&P (Signed)
NAMEFABIANO, [name]                              ACCOUNT NO.:  [id number]   MEDICAL RECORD NO.:  [id number]                   PATIENT TYPE:  OIB   LOCATION:  2899                                 FACILITY:  [facility]   PHYSICIAN:  Garey Ham, M.D.             DATE OF BIRTH:  [birth year]   DATE OF ADMISSION:  06/06/2002  DATE OF DISCHARGE:  06/06/2002                                HISTORY & PHYSICAL   REASON FOR ADMISSION:  This was a planned outpatient surgical readmission of  this 76 year-old black male admitted for cataract implant surgery of the  left eye.   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  A  combination posterior vitrectomy scleral buckling procedure was performed  under general anesthesia. Postoperatively, retinal reattachment was  achieved.  A small fixed fold was noted in the macular area.  Subsequently,  the patient developed nuclear and posterior subcapsular cataract formation.  When vision deteriorated to 20/200 it was elected to proceed with surgery.  The patient signed an informed consent and arrangements were made for his  outpatient admission at this time.   PAST MEDICAL HISTORY:  The patient is in stable general health under the  care of Dr. Marlou Sa. The patient was last seen in September 2003.  His A1c  level of his non-insulin dependent diabetes was slightly elevated at that  time.  The patient was felt to be in satisfactory condition for the surgery  under local anesthesia.  Please send Dr. Marlou Sa copies of these notes and any  associated laboratory data, chest x-ray or EKG material.   CURRENT MEDICATIONS:  1. Glucophage.  2. Singulair.  3. Avandia.  4. Saw palmetto.  5. Glucosamine.   REVIEW OF SYMPTOMS:  CARDIORESPIRATORY:  No cardiorespiratory complaints.   PHYSICAL EXAMINATION:  VITAL SIGNS:  As recorded on admission; blood  pressure  117/64, pulse 62, respirations 20, temperature 97.7.  GENERAL APPEARANCE:  The patient is a pleasant, well developed, well  nourished black male in no acute distress.  HEENT:  Eyes:  Visual acuity 20/20 right eye, 20/200 left eye.  External  ocular and slit lamp examination:  The eyes are white and clear with a clear  cornea and deep and clear anterior chamber.  Nuclear and posterior  subcapsular cataract formation is noted in the left eye.  Applanation  tonometry left eye 18 mm.  Detailed fundus examination:  There is definite  lens cataract haze.  The vitreous appears clear. The retina is attached with  a superior scleral buckling indentation.  The optic nerve is sharply  outlined with good color with a disc cup ratio of 0.4. The macular area  reveals a tiny linear fixed fold extending through the superior macula  towards the peripheral retina.  No open retinal tears  or detachment areas  are seen.  CHEST:  Lungs are clear to percussion and auscultation.  HEART:  Normal sinus rhythm.  No cardiomegaly.  No murmurs.  ABDOMEN:  Negative.  EXTREMITIES:  Negative.   ADMISSION DIAGNOSIS:  Senile nuclear cataract left eye status post scleral  buckling and pars plana vitrectomy left eye.   SURGICAL PLAN:  Cataract implant surgery.  The patient has been given oral  discussion and printed information concerning the procedure and possible  complications.  He realizes that cataract surgery will hopefully improve  vision but not necessarily affect the fixed fold and previous history of  retinal detachment surgery.                                               Garey Ham, M.D.    HNJ/MEDQ  D:  06/09/2002  T:  06/09/2002  Job:  [id number]   cc:   Randall Hiss L. Marlou Sa, M.D.  [address] [place] [id number]  Fax: [phone]

## 2010-08-29 NOTE — Op Note (Signed)
[name], [name]                              ACCOUNT NO.:  [id number]   MEDICAL RECORD NO.:  [id number]                   PATIENT TYPE:  OIB   LOCATION:  [id number]                                 FACILITY:  [facility]   PHYSICIAN:  Garey Ham, M.D.             DATE OF BIRTH:  1934-10-06   DATE OF PROCEDURE:  06/16/2002  DATE OF DISCHARGE:  06/17/2002                                 OPERATIVE REPORT   PREOPERATIVE DIAGNOSIS:  Retained dislocated cataract fragments left eye,  status post vitrectomy surgery left eye, status post cataract implant  surgery left eye.   OPERATION:  Posterior vitrectomy through pars plana using vitreous infusion  suction cutter with removal of dislocated retained cataract fragments.   SURGEON:  Garey Ham, M.D.   ASSISTANT:  Nurse.   ANESTHESIA:  General.   OPHTHALMOSCOPY:  As previously described.   DESCRIPTION OF PROCEDURE:  After the patient was prepped and draped, a lid  speculum was inserted in the left eye.  A superior rectus traction suture  was placed.  Peritomy was performed adjacent to the limbus at the 10  o'clock, 2 o'clock, and 4 o'clock position.  The subconjunctival tissue was  cleaned and three new sclerotomy sites prepared using the MVR blade.  The 4  mm vitreous infusion terminal was secured in place at the 4 o'clock position  with a 5-0 white mattress Dacron suture.  The fiberoptic light pipe was  inserted at the 2 o'clock and the handpiece, vitreous infusion suction  cutter at the 10 o'clock position.  Slow vitreous infusion suction cutting  were begun from an anterior to posterior direction.  Peripheral cortex  fragments were present and these were aspirated first.  The posterior  capsule appeared with a central opening and this was not enlarged.  The  posterior chamber implant which was residing on the posterior capsule  appeared to be in good position Following removal of the anterior lens  cortex fragments, attention  was paid to the posterior retin and it was noted  that there were several large cortex fragments remaining on the surface of  the retina.  The vitreous was once again aspirated and removed with any  peripheral cortex fragments anteriorly.  The aspirator was then used to  aspirate the light cortex fragments from the surface of the retina.  There  were no remaining fragments seen.  Indirect ophthalmoscopy was then  performed confirming  the presence of no peripheral or posterior retained  lens fragments. It was therefore elected to close.   The sclerotomy sites were closed with 7-0 interrupted Vicryl sutures.  The  conjunctiva was then closed with a running 7-0 Vicryl suture.  Depo-Medrol,  Garamycin and Dexamethasone were injected in the subtenon space inferiorly.  Maxitrol and Atropine ointment were instilled in the operated left eye.  Duration of the procedure was one hour.  The patient tolerated  the procedure  well in general.  Left the operating room for the recovery room in good  condition.                                               Garey Ham, M.D.    HNJ/MEDQ  D:  07/30/2002  T:  07/30/2002  Job:  [id number]

## 2010-08-29 NOTE — H&P (Signed)
[place]  Patient:    [name], [name]                           MRN: [id number] Adm. Date:  11/04/99 Attending:  Randall Hiss L. Marlou Sa, M.D.                         History and Physical  CHIEF COMPLAINT:  New onset fever, low back pain, and weakness.  HISTORY OF PRESENT ILLNESS:  This is the first recent [facility] admission for this 76 year old married black male with long standing history of hypertension and diabetes mellitus who was doing well until 24 hours prior to admission. He states that he developed the onset last night of a lower abdominal pain.  This is associated with a cramping sensation. No associated nausea.  He later noted lower back pain therefore which was a dull aching in nature.  The patient did not note any change in his ability to void.  No dysuria.  His symptoms persisted during the night.  He did note occasional chill. No associated cough.  This morning he had diffuse arthralgias with a mild headache, low back pain persisting with decreased appetite.  The patient came to the office for further evaluation. He was noted to have a fever of 100.3 with tachycardia and mild hypotension with dehydration.  The patient was transferred over to [facility] Emergency Room for further evaluation.  Past medical history as noted above.  History of allergic rhinitis, non-insulin dependent diabetes mellitus, and hypertension.  MEDICATIONS:  Trental at 400 mg b.i.d.  Zyrtec 10 mg p.o. q.h.s. Xanax 0.25 mg t.i.d. p.r.n.  Astelin nasal spray two puffs b.i.d.  Cardura 2 mg p.o. q.h.s. and Glucophage XR 1000 mg p.o. q.d.  One baby aspirin p.o. q.d.  PAST MEDICAL HISTORY:  Otherwise notable for distant history of BPH and prostatitis.  Distant history of head trauma greater than 15 years ago.  HABITS:  Previous smoker.  Past history of ETOH abuse, presently abstaining.  REVIEW OF SYSTEMS:  As noted above. Notably he recalls eating food from  [place] several hours prior to the onset of his abdominal symptoms.  He is unaware of anyone else being ill however.  PHYSICAL EXAMINATION:  GENERAL:  He is a well-developed, ill appearing black male in no acute distress however.  VITAL SIGNS:  Blood pressure 122/78, pulse 120, respiratory rate 16, temperature 101.8.  HEENT:  Head normocephalic and atraumatic without bruits.  Extraocular muscles are intact.  Fundi are grade 1.  There is no sinus tenderness to palpation. Mild turbinate edema right greater than left.  TMs with decreased light reflex.  There is mild erythema noted bilaterally.  Throat:  Membranes were dry.  Posterior pharynx was clear.  NECK:  Supple.  No enlarged thyroid.  Negative Kernigs presently.  LUNGS:  Distant breath sounds without wheezes or rales.  No CVA tenderness appreciated.  CARDIOVASCULAR:  Rapid rhythm.  Normal S1 and S2, 1/6 systolic ejection murmur at the lower left sternal border.  No rub appreciated.  ABDOMEN:  Mild distention. Bowel sounds are present.  Minimal suprapubic discomfort to direct palpation.  MUSCULOSKELETAL:  Full passive range of motion without cyanosis or clubbing. Mild crepitus of knees.  Negative Homans. No edema.  NEUROLOGIC:  He is alert, oriented to person and place.  Slightly slow on responding.  Moves all extremities.  Strength is intact.  SKIN: Without active lesions.  LABORATORY DATA:  CBC reveals WBC of 5000, hemoglobin 14.0, hematocrit 40.3, 92% polys, 4% lymphs.  Chemistry:  Sodium 138, potassium 3.8, chloride 103, CO2 24, BUN 16, creatinine 1.1, glucose 186, calcium 9.1, total protein 6.6, albumin 3.6.  Urinalysis, dip stick in office was negative for leukocytes with 1+ protein.  X-ray is pending.  EKG:  Normal sinus rhythm.  Left axis deviation.  Nonspecific ST-T wave changes.  IMPRESSION: 1. Fever of questionable etiology, rule out sepsis versus viral syndrome. 2. Low back pain secondary to #1. Rule out  recurrent prostatitis versus    other. 3. Diabetes mellitus. 4. Dehydration. 5. Sinus tachycardia secondary to the above. 6. Mild otitis media. 7. Allergic rhinitis. 8. Rule out atypical enteritis.  PLAN:  CBC with differential as ordered.  Blood cultures times two.  Will place the patient on IV hydration with half normal saline with 20 KCl per liter at 150 to 200 cc per hour.  Tequin 400 mg IV q.d.  Follow up chest x-ray, flat and upright of the abdomen.  Will culture urine as well.  Tylenol as needed at this time. Further therapy depending response to the above. DD:  11/04/99 TD:  11/05/99 Job: [id number]

## 2010-11-05 ENCOUNTER — Encounter: Payer: Self-pay | Admitting: Podiatry

## 2011-02-12 HISTORY — PX: PROSTATE SURGERY: SHX751

## 2011-02-13 ENCOUNTER — Encounter: Payer: Self-pay | Admitting: Pulmonary Disease

## 2011-02-16 ENCOUNTER — Ambulatory Visit: Payer: Self-pay | Admitting: Internal Medicine

## 2011-03-18 ENCOUNTER — Other Ambulatory Visit: Payer: Self-pay | Admitting: Cardiology

## 2011-03-30 ENCOUNTER — Ambulatory Visit: Payer: Self-pay | Admitting: Internal Medicine

## 2011-03-30 ENCOUNTER — Other Ambulatory Visit: Payer: Self-pay | Admitting: Cardiology

## 2011-04-27 ENCOUNTER — Encounter: Payer: Self-pay | Admitting: Internal Medicine

## 2011-04-27 ENCOUNTER — Ambulatory Visit (INDEPENDENT_AMBULATORY_CARE_PROVIDER_SITE_OTHER): Payer: Medicare Other | Admitting: Internal Medicine

## 2011-04-27 VITALS — BP 126/78 | HR 69 | Ht 74.5 in | Wt 222.8 lb

## 2011-04-27 DIAGNOSIS — J029 Acute pharyngitis, unspecified: Secondary | ICD-10-CM

## 2011-04-27 DIAGNOSIS — G4733 Obstructive sleep apnea (adult) (pediatric): Secondary | ICD-10-CM

## 2011-04-27 DIAGNOSIS — J309 Allergic rhinitis, unspecified: Secondary | ICD-10-CM

## 2011-04-27 NOTE — Assessment & Plan Note (Signed)
He has used asked her pro and OTC antihistamines when needed. We discussed upcoming spring pollen season.

## 2011-04-27 NOTE — Progress Notes (Signed)
04/27/11- 77 year old male former smoker followed for obstructive sleep apnea, allergic rhinitis, asthma with complaint of persistent sore throat. LOV- 02/18/10 Has had flu vaccine. No recent significant respiratory problems or infections. He continues to use CPAP all night every night and sleeps well. He describes persistent mild sore throat which he blames on post nasal drip. It is better than it used to be. He is not aware of reflux. There is no dysphagia or hoarseness and no change in his voice. He indicates CPAP is working fine at 5 CWP/Advanced.   ROS-see HPI Constitutional:   No-   weight loss, night sweats, fevers, chills, fatigue, lassitude. HEENT:   No-  headaches, difficulty swallowing, tooth/dental problems,  +sore throat,       No-  sneezing, itching, ear ache, nasal congestion, post nasal drip,  CV:  No-   chest pain, orthopnea, PND, swelling in lower extremities, anasarca, dizziness, palpitations Resp: No-   shortness of breath with exertion or at rest.              No-   productive cough,  No non-productive cough,  No- coughing up of blood.              No-   change in color of mucus.  No- wheezing.   Skin: No-   rash or lesions. GI:  No-   heartburn, indigestion, abdominal pain, nausea, vomiting, diarrhea,                 change in bowel habits, loss of appetite GU: MS:  No-   joint pain or swelling.  No- decreased range of motion.  No- back pain. Neuro-     nothing unusual Psych:  No- change in mood or affect. No depression or anxiety.  No memory loss.  OBJ General- Alert, Oriented, Affect-appropriate, Distress- none acute Skin- rash-none, lesions- none, excoriation- none Lymphadenopathy- none Head- atraumatic            Eyes- Gross vision intact, PERRLA, conjunctivae clear secretions            Ears- Hearing, canals-normal            Nose- Clear, no-Septal dev, mucus, polyps, erosion, perforation             Throat- Mallampati III-IV , mucosa clear , drainage- none,  tonsils- atrophic, no redness or hoarseness. Neck- flexible , trachea midline, no stridor , thyroid nl, carotid no bruit Chest - symmetrical excursion , unlabored           Heart/CV- RRR , no murmur , no gallop  , no rub, nl s1 s2                           - JVD- none , edema- none, stasis changes- none, varices- none           Lung- clear to P&A, wheeze- none, cough- none , dullness-none, rub- none           Chest wall-  Abd-  Br/ Gen/ Rectal- Not done, not indicated Extrem- cyanosis- none, clubbing, none, atrophy- none, strength- nl Neuro- grossly intact to observation

## 2011-04-27 NOTE — Patient Instructions (Signed)
Persistent sore throat often comes from postnasal drip or reflux of stomach acid, so watch for those.  Please call for medicine refills as needed  Continue CPAP

## 2011-04-27 NOTE — Assessment & Plan Note (Signed)
This is a chronic complaint but he says it is much better than it used to be. It does not interfere with speech or swallowing. I suspect either postnasal drainage or reflux and educated him to watch for both.

## 2011-04-27 NOTE — Assessment & Plan Note (Signed)
Good compliance and control, 5 CWP/Advanced.

## 2011-07-29 ENCOUNTER — Other Ambulatory Visit: Payer: Self-pay | Admitting: Cardiology

## 2011-08-11 ENCOUNTER — Other Ambulatory Visit: Payer: Self-pay | Admitting: Cardiology

## 2011-09-29 ENCOUNTER — Other Ambulatory Visit: Payer: Self-pay | Admitting: Cardiology

## 2011-12-17 ENCOUNTER — Other Ambulatory Visit: Payer: Self-pay | Admitting: Cardiology

## 2012-01-27 ENCOUNTER — Telehealth: Payer: Self-pay | Admitting: Internal Medicine

## 2012-01-27 DIAGNOSIS — G4733 Obstructive sleep apnea (adult) (pediatric): Secondary | ICD-10-CM

## 2012-01-27 NOTE — Telephone Encounter (Signed)
Order given to [facility] Sally E Ottinger ° °

## 2012-01-27 NOTE — Telephone Encounter (Signed)
I spoke with pt and he stated his humidifier has broken on his cpap machine. He also needs new mask/filter, tubing. I have sent order to pcc's. Pt asks this be sent to [facility] today since he will be in town shortly. Please advise [facility] thanks

## 2012-03-04 LAB — HM DIABETES EYE EXAM

## 2012-03-09 LAB — BASIC METABOLIC PANEL
BUN: 10 mg/dL (ref 4–21)
Glucose: 159 mg/dL
Potassium: 4.1 mmol/L (ref 3.4–5.3)

## 2012-03-09 LAB — LIPID PANEL
Cholesterol: 103 mg/dL (ref 0–200)
HDL: 41 mg/dL (ref 35–70)
LDL Cholesterol: 55 mg/dL
LDl/HDL Ratio: 2.5
Triglycerides: 34 mg/dL — AB (ref 40–160)

## 2012-03-09 LAB — CBC AND DIFFERENTIAL
HCT: 39 % — AB (ref 41–53)
Hemoglobin: 12.8 g/dL — AB (ref 13.5–17.5)
Platelets: 194 10*3/uL (ref 150–399)
WBC: 4.4 10^3/mL

## 2012-03-09 LAB — HEMOGLOBIN A1C: Hgb A1c MFr Bld: 9.1 % — AB (ref 4.0–6.0)

## 2012-03-09 LAB — TSH: TSH: 1.15 u[IU]/mL (ref 0.41–5.90)

## 2012-04-26 ENCOUNTER — Ambulatory Visit (INDEPENDENT_AMBULATORY_CARE_PROVIDER_SITE_OTHER)
Admission: RE | Admit: 2012-04-26 | Discharge: 2012-04-26 | Disposition: A | Discharge status: Home / Self Care | Payer: Medicare Other | Source: Ambulatory Visit | Attending: Internal Medicine | Admitting: Internal Medicine

## 2012-04-26 ENCOUNTER — Telehealth: Payer: Self-pay | Admitting: Internal Medicine

## 2012-04-26 ENCOUNTER — Encounter: Payer: Self-pay | Admitting: Internal Medicine

## 2012-04-26 ENCOUNTER — Ambulatory Visit (INDEPENDENT_AMBULATORY_CARE_PROVIDER_SITE_OTHER): Payer: Medicare Other | Admitting: Internal Medicine

## 2012-04-26 VITALS — BP 138/62 | HR 72 | Ht 74.5 in | Wt 225.0 lb

## 2012-04-26 DIAGNOSIS — G4733 Obstructive sleep apnea (adult) (pediatric): Secondary | ICD-10-CM

## 2012-04-26 DIAGNOSIS — Z87891 Personal history of nicotine dependence: Secondary | ICD-10-CM

## 2012-04-26 DIAGNOSIS — J309 Allergic rhinitis, unspecified: Secondary | ICD-10-CM

## 2012-04-26 DIAGNOSIS — J45909 Unspecified asthma, uncomplicated: Secondary | ICD-10-CM

## 2012-04-26 DIAGNOSIS — J4520 Mild intermittent asthma, uncomplicated: Secondary | ICD-10-CM

## 2012-04-26 NOTE — Patient Instructions (Addendum)
We can continue CPAP 5/ apria  Order  CXR   Dx asthma, hx tobacco use  Please call as needed

## 2012-04-26 NOTE — Progress Notes (Signed)
04/27/11- 78 year old male former smoker followed for obstructive sleep apnea, allergic rhinitis, asthma with complaint of persistent sore throat. LOV- 02/18/10 Has had flu vaccine. No recent significant respiratory problems or infections. He continues to use CPAP all night every night and sleeps well. He describes persistent mild sore throat which he blames on post nasal drip. It is better than it used to be. He is not aware of reflux. There is no dysphagia or hoarseness and no change in his voice. He indicates CPAP is working fine at 5 CWP/Advanced.  04/26/12- 78 year old male former smoker followed for obstructive sleep apnea, allergic rhinitis, asthma with complaint of persistent sore throat. FOLLOWS FOR: wears CPAP 5/ Advanced 95% of the time-tries every night unless he forgets to put on; about 6 hours He is satisfied that since control is good. Allergic rhinitis has bothered him some. Strong odors and irritants also make him sneeze. Eating triggers watery rhinorrhea. He has glaucoma which would limit use of ipratropium. Asthma has not been a problem with no recent congestion or wheeze  ROS-see HPI Constitutional:   No-   weight loss, night sweats, fevers, chills, fatigue, lassitude. HEENT:   No-  headaches, difficulty swallowing, tooth/dental problems,  +sore throat,       No-  sneezing, itching, ear ache, nasal congestion, post nasal drip,  CV:  No-   chest pain, orthopnea, PND, swelling in lower extremities, anasarca, dizziness, palpitations Resp: No-   shortness of breath with exertion or at rest.              No-   productive cough,  No non-productive cough,  No- coughing up of blood.              No-   change in color of mucus.  No- wheezing.   Skin: No-   rash or lesions. GI:  No-   heartburn, indigestion, abdominal pain, nausea, vomiting,  GU: MS:  No-   joint pain or swelling.   Neuro-     nothing unusual Psych:  No- change in mood or affect. No depression or anxiety.  No memory  loss.  OBJ General- Alert, Oriented, Affect-,, Distress- none acute Skin- rash-none, lesions- none, excoriation- none Lymphadenopathy- none Head- atraumatic            Eyes- Gross vision intact, PERRLA, conjunctivae clear secretions            Ears- Hearing, canals-normal            Nose- Clear, no-Septal dev, mucus, polyps, erosion, perforation             Throat- Mallampati III-IV , mucosa clear , drainage- none, tonsils- atrophic, no redness or hoarseness. Neck- flexible , trachea midline, no stridor , thyroid nl, carotid no bruit Chest - symmetrical excursion , unlabored           Heart/CV- RRR , no murmur , no gallop  , no rub, nl s1 s2                           - JVD- none , edema- none, stasis changes- none, varices- none           Lung- clear to P&A, wheeze- none, cough- none , dullness-none, rub- none           Chest wall-  Abd-  Br/ Gen/ Rectal- Not done, not indicated Extrem- cyanosis- none, clubbing, none, atrophy- none, strength- nl Neuro- grossly intact  to observation

## 2012-04-27 NOTE — Progress Notes (Signed)
Quick Note:  Pt aware of results. ______ 

## 2012-05-04 ENCOUNTER — Encounter: Payer: Self-pay | Admitting: Hematology

## 2012-05-08 NOTE — Assessment & Plan Note (Signed)
We discussed use of Asepro nasal spray

## 2012-05-08 NOTE — Assessment & Plan Note (Signed)
Good compliance and control 

## 2012-05-08 NOTE — Assessment & Plan Note (Signed)
Well controlled now with no intervention needed.

## 2012-05-16 NOTE — Telephone Encounter (Signed)
error 

## 2013-03-28 ENCOUNTER — Telehealth: Payer: Self-pay | Admitting: Internal Medicine

## 2013-03-28 DIAGNOSIS — G4733 Obstructive sleep apnea (adult) (pediatric): Secondary | ICD-10-CM

## 2013-03-28 NOTE — Telephone Encounter (Signed)
Order has been placed for new CPAP mask. Pt is aware.

## 2013-04-10 ENCOUNTER — Ambulatory Visit (INDEPENDENT_AMBULATORY_CARE_PROVIDER_SITE_OTHER): Payer: Medicare Other | Admitting: Podiatry

## 2013-04-10 ENCOUNTER — Encounter: Payer: Self-pay | Admitting: Podiatry

## 2013-04-10 VITALS — BP 130/74 | HR 69 | Resp 18

## 2013-04-10 DIAGNOSIS — B351 Tinea unguium: Secondary | ICD-10-CM

## 2013-04-10 DIAGNOSIS — M79609 Pain in unspecified limb: Secondary | ICD-10-CM

## 2013-04-10 NOTE — Progress Notes (Signed)
   Subjective:    Patient ID: [name], male    DOB: 11-28-1934, 78 y.o.   MRN: [id number]  HPI I just need my toenails cut Patient presents intermittently for debridement of painful mycotic toenails. She's been a patient in this practice since 2009. The last visit for this service was 03/17/2012.    Review of Systems     Objective:   Physical Exam Elongated, hypertrophic, discolored, incurvated toenails x10       Assessment & Plan:   Assessment: Symptomatic onychomycoses x10  Plan: All 10 toenails are debrided without a bleeding. Reappoint at patient's request.

## 2013-04-26 ENCOUNTER — Ambulatory Visit: Payer: Medicare Other | Admitting: Internal Medicine

## 2013-07-10 ENCOUNTER — Encounter: Payer: Self-pay | Admitting: Gastroenterology

## 2013-07-25 ENCOUNTER — Encounter: Payer: Self-pay | Admitting: Gastroenterology

## 2013-09-11 ENCOUNTER — Ambulatory Visit (AMBULATORY_SURGERY_CENTER): Payer: Self-pay | Admitting: *Deleted

## 2013-09-11 VITALS — Ht 74.0 in | Wt 219.0 lb

## 2013-09-11 DIAGNOSIS — Z86010 Personal history of colonic polyps: Secondary | ICD-10-CM

## 2013-09-11 MED ORDER — NA SULFATE-K SULFATE-MG SULF 17.5-3.13-1.6 GM/177ML PO SOLN: 1.0000 | Freq: Once | ORAL | Status: DC

## 2013-09-11 NOTE — Progress Notes (Signed)
No allergies to eggs or soy. No problems with anesthesia.  Pt given Emmi instructions for colonoscopy  No oxygen use  No diet drug use  

## 2013-09-12 ENCOUNTER — Encounter: Payer: Self-pay | Admitting: Gastroenterology

## 2013-10-02 ENCOUNTER — Encounter: Payer: Self-pay | Admitting: Gastroenterology

## 2013-10-02 ENCOUNTER — Ambulatory Visit (AMBULATORY_SURGERY_CENTER): Payer: Medicare Other | Admitting: Gastroenterology

## 2013-10-02 VITALS — BP 126/74 | HR 61 | Temp 96.7°F | Resp 31 | Ht 74.0 in | Wt 219.0 lb

## 2013-10-02 DIAGNOSIS — K5730 Diverticulosis of large intestine without perforation or abscess without bleeding: Secondary | ICD-10-CM

## 2013-10-02 DIAGNOSIS — Z86010 Personal history of colonic polyps: Secondary | ICD-10-CM

## 2013-10-02 DIAGNOSIS — D126 Benign neoplasm of colon, unspecified: Secondary | ICD-10-CM

## 2013-10-02 DIAGNOSIS — D122 Benign neoplasm of ascending colon: Secondary | ICD-10-CM

## 2013-10-02 LAB — GLUCOSE, CAPILLARY
Glucose-Capillary: 139 mg/dL — ABNORMAL HIGH (ref 70–99)
Glucose-Capillary: 121 mg/dL — ABNORMAL HIGH (ref 70–99)

## 2013-10-02 MED ORDER — SODIUM CHLORIDE 0.9 % IV SOLN: 500.0000 mL | INTRAVENOUS | Status: DC

## 2013-10-02 NOTE — Progress Notes (Signed)
Procedure ends, to recovery, report given and VSS. 

## 2013-10-02 NOTE — Op Note (Signed)
[place]  [employer] [place], [id number]   COLONOSCOPY PROCEDURE REPORT  PATIENT: [name], Wall.  MR#: [id number] BIRTHDATE: 18-Aug-1934 , 79  yrs. old GENDER: Male ENDOSCOPIST: Inda Castle, MD REFERRED [id number] Marlou Sa, M.D. PROCEDURE DATE:  10/02/2013 PROCEDURE:   Colonoscopy with snare polypectomy First Screening Colonoscopy - Avg.  risk and is 50 yrs.  old or older - No.  Prior Negative Screening - Now for repeat screening. N/A  History of Adenoma - Now for follow-up colonoscopy & has been > or = to 3 yrs.  Yes hx of adenoma.  Has been 3 or more years since last colonoscopy.  Polyps Removed Today? Yes. ASA CLASS:   Class II INDICATIONS:Patient's personal history of colon polyps 2005 (pathology report not available) MEDICATIONS: MAC sedation, administered by CRNA and Propofol (Diprivan) 230 mg IV  DESCRIPTION OF PROCEDURE:   After the risks benefits and alternatives of the procedure were thoroughly explained, informed consent was obtained.  A digital rectal exam revealed no abnormalities of the rectum.   The [id number]  endoscope was introduced through the anus and advanced to the cecum, which was identified by both the appendix and ileocecal valve. No adverse events experienced.   The quality of the prep was Suprep good  The instrument was then slowly withdrawn as the colon was fully examined.      COLON FINDINGS: A sessile polyp measuring 6 mm in size was found in the ascending colon.  A polypectomy was performed with a cold snare and with cold forceps.  The resection was complete and the polyp tissue was completely retrieved.   Moderate diverticulosis was noted in the ascending colon.   The colon was otherwise normal. There was no diverticulosis, inflammation, polyps or cancers unless previously stated.  Retroflexed views revealed no abnormalities. The time to cecum=7 minutes 50 seconds.  Withdrawal time=15 minutes 09 seconds.  The  scope was withdrawn and the procedure completed. COMPLICATIONS: There were no complications.  ENDOSCOPIC IMPRESSION: 1.   Sessile polyp measuring 6 mm in size was found in the ascending colon; polypectomy was performed with a cold snare and with cold forceps 2.   Moderate diverticulosis was noted in the ascending colon 3.   The colon was otherwise normal  RECOMMENDATIONS: Given your age, you will not need another colonoscopy for colon cancer screening or polyp surveillance.  These types of tests usually stop around the age 45.   eSigned:  Inda Castle, MD 10/02/2013 11:02 AM   cc:   PATIENT NAME:  [name] MR#: [id number]

## 2013-10-02 NOTE — Patient Instructions (Signed)
Discharge instructions given with verbal understanding. Handouts on polyps and diverticulosis. Resume previous medications. YOU HAD AN ENDOSCOPIC PROCEDURE TODAY AT THE [facility]: Refer to the procedure report that was given to you for any specific questions about what was found during the examination.  If the procedure report does not answer your questions, please call your gastroenterologist to clarify.  If you requested that your care partner not be given the details of your procedure findings, then the procedure report has been included in a sealed envelope for you to review at your convenience later.  YOU SHOULD EXPECT: Some feelings of bloating in the abdomen. Passage of more gas than usual.  Walking can help get rid of the air that was put into your GI tract during the procedure and reduce the bloating. If you had a lower endoscopy (such as a colonoscopy or flexible sigmoidoscopy) you may notice spotting of blood in your stool or on the toilet paper. If you underwent a bowel prep for your procedure, then you may not have a normal bowel movement for a few days.  DIET: Your first meal following the procedure should be a light meal and then it is ok to progress to your normal diet.  A half-sandwich or bowl of soup is an example of a good first meal.  Heavy or fried foods are harder to digest and may make you feel nauseous or bloated.  Likewise meals heavy in dairy and vegetables can cause extra gas to form and this can also increase the bloating.  Drink plenty of fluids but you should avoid alcoholic beverages for 24 hours.  ACTIVITY: Your care partner should take you home directly after the procedure.  You should plan to take it easy, moving slowly for the rest of the day.  You can resume normal activity the day after the procedure however you should NOT DRIVE or use heavy machinery for 24 hours (because of the sedation medicines used during the test).    SYMPTOMS TO REPORT  IMMEDIATELY: A gastroenterologist can be reached at any hour.  During normal business hours, 8:30 AM to 5:00 PM Monday through Friday, call [phone].  After hours and on weekends, please call the GI answering service at [phone] who will take a message and have the physician on call contact you.   Following lower endoscopy (colonoscopy or flexible sigmoidoscopy):  Excessive amounts of blood in the stool  Significant tenderness or worsening of abdominal pains  Swelling of the abdomen that is new, acute  Fever of 100F or higher  FOLLOW UP: If any biopsies were taken you will be contacted by phone or by letter within the next 1-3 weeks.  Call your gastroenterologist if you have not heard about the biopsies in 3 weeks.  Our staff will call the home number listed on your records the next business day following your procedure to check on you and address any questions or concerns that you may have at that time regarding the information given to you following your procedure. This is a courtesy call and so if there is no answer at the home number and we have not heard from you through the emergency physician on call, we will assume that you have returned to your regular daily activities without incident.  SIGNATURES/CONFIDENTIALITY: You and/or your care partner have signed paperwork which will be entered into your electronic medical record.  These signatures attest to the fact that that the information above on your After Visit Summary   has been reviewed and is understood.  Full responsibility of the confidentiality of this discharge information lies with you and/or your care-partner. 

## 2013-10-02 NOTE — Progress Notes (Signed)
Called to room to assist during endoscopic procedure.  Patient ID and intended procedure confirmed with present staff. Received instructions for my participation in the procedure from the performing physician.  

## 2013-10-03 ENCOUNTER — Telehealth: Payer: Self-pay | Admitting: *Deleted

## 2013-10-03 NOTE — Telephone Encounter (Signed)
  Follow up Call-  Call back number 10/02/2013  Post procedure Call Back phone  # [phone]  Permission to leave phone message Yes     Patient questions:  Do you have a fever, pain , or abdominal swelling? no Pain Score  0 *  Have you tolerated food without any problems? yes  Have you been able to return to your normal activities? yes  Do you have any questions about your discharge instructions: Diet   no Medications  no Follow up visit  no  Do you have questions or concerns about your Care? no  Actions: * If pain score is 4 or above: No action needed, pain <4.

## 2013-10-06 ENCOUNTER — Encounter: Payer: Self-pay | Admitting: Gastroenterology

## 2013-10-17 ENCOUNTER — Emergency Department (HOSPITAL_COMMUNITY)
Admission: EM | Admit: 2013-10-17 | Discharge: 2013-10-17 | Disposition: A | Discharge status: Home / Self Care | Payer: Medicare Other | Attending: Emergency Medicine | Admitting: Emergency Medicine

## 2013-10-17 ENCOUNTER — Encounter (HOSPITAL_COMMUNITY): Payer: Self-pay | Admitting: Emergency Medicine

## 2013-10-17 ENCOUNTER — Emergency Department (HOSPITAL_COMMUNITY): Payer: Medicare Other

## 2013-10-17 DIAGNOSIS — Z7982 Long term (current) use of aspirin: Secondary | ICD-10-CM | POA: Insufficient documentation

## 2013-10-17 DIAGNOSIS — Y9364 Activity, baseball: Secondary | ICD-10-CM | POA: Insufficient documentation

## 2013-10-17 DIAGNOSIS — Z791 Long term (current) use of non-steroidal anti-inflammatories (NSAID): Secondary | ICD-10-CM | POA: Insufficient documentation

## 2013-10-17 DIAGNOSIS — J309 Allergic rhinitis, unspecified: Secondary | ICD-10-CM | POA: Insufficient documentation

## 2013-10-17 DIAGNOSIS — Z87891 Personal history of nicotine dependence: Secondary | ICD-10-CM | POA: Insufficient documentation

## 2013-10-17 DIAGNOSIS — E785 Hyperlipidemia, unspecified: Secondary | ICD-10-CM | POA: Insufficient documentation

## 2013-10-17 DIAGNOSIS — Z87820 Personal history of traumatic brain injury: Secondary | ICD-10-CM | POA: Insufficient documentation

## 2013-10-17 DIAGNOSIS — Z9911 Dependence on respirator [ventilator] status: Secondary | ICD-10-CM | POA: Insufficient documentation

## 2013-10-17 DIAGNOSIS — I10 Essential (primary) hypertension: Secondary | ICD-10-CM | POA: Insufficient documentation

## 2013-10-17 DIAGNOSIS — Z8669 Personal history of other diseases of the nervous system and sense organs: Secondary | ICD-10-CM | POA: Insufficient documentation

## 2013-10-17 DIAGNOSIS — E119 Type 2 diabetes mellitus without complications: Secondary | ICD-10-CM | POA: Insufficient documentation

## 2013-10-17 DIAGNOSIS — Z79899 Other long term (current) drug therapy: Secondary | ICD-10-CM | POA: Insufficient documentation

## 2013-10-17 DIAGNOSIS — Z872 Personal history of diseases of the skin and subcutaneous tissue: Secondary | ICD-10-CM | POA: Insufficient documentation

## 2013-10-17 DIAGNOSIS — Y92838 Other recreation area as the place of occurrence of the external cause: Secondary | ICD-10-CM

## 2013-10-17 DIAGNOSIS — W219XXA Striking against or struck by unspecified sports equipment, initial encounter: Secondary | ICD-10-CM | POA: Insufficient documentation

## 2013-10-17 DIAGNOSIS — Y9239 Other specified sports and athletic area as the place of occurrence of the external cause: Secondary | ICD-10-CM | POA: Insufficient documentation

## 2013-10-17 DIAGNOSIS — S20219A Contusion of unspecified front wall of thorax, initial encounter: Secondary | ICD-10-CM

## 2013-10-17 MED ORDER — IBUPROFEN 400 MG PO TABS
400.0000 mg | ORAL_TABLET | Freq: Once | ORAL | Status: DC
Start: 2013-10-17 — End: 2013-10-17
  Filled 2013-10-17: qty 1

## 2013-10-17 MED ORDER — IBUPROFEN 400 MG PO TABS
600.0000 mg | ORAL_TABLET | Freq: Once | ORAL | Status: AC
  Administered 2013-10-17: 600 mg via ORAL
  Filled 2013-10-17 (×2): qty 1

## 2013-10-17 MED ORDER — HYDROCODONE-ACETAMINOPHEN 5-325 MG PO TABS: 1.0000 | ORAL_TABLET | ORAL | Status: DC | PRN
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
## 2013-10-17 NOTE — Discharge Instructions (Signed)
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
## 2014-04-03 ENCOUNTER — Telehealth: Payer: Self-pay | Admitting: Internal Medicine

## 2014-04-03 MED ORDER — FLUTICASONE-SALMETEROL 100-50 MCG/DOSE IN AEPB: 1.0000 | INHALATION_SPRAY | Freq: Two times a day (BID) | RESPIRATORY_TRACT | Status: DC

## 2014-04-03 NOTE — Telephone Encounter (Signed)
Called and spoke to pt. Pt c/o increase in SOB and mid chest pain- pt unable to discribe the pain. SOB and CP have been occuring for 2 days. Pt calm and able to complete full sentences while talking. Pt denies cough, f/c/s, and swelling. Pt has not been seen since 04/2012. Pending appt with TP on 04/20/2014. Pt stated he was on advair and it was helping but he doesn't have any refills.   Dr. Annamaria Boots please advise.   Allergies  Allergen Reactions  . Zithromax [Azithromycin Dihydrate]     Per pt: unknown  . Sulfonamide Derivatives Rash    Current Outpatient Prescriptions on File Prior to Visit  Medication Sig Dispense Refill  . ALPHAGAN P 0.1 % SOLN Place 1 drop into the left eye 2 (two) times daily.     Marland Kitchen ALPRAZolam (XANAX) 0.5 MG tablet Take 0.5 mg by mouth daily as needed for anxiety.     . Ascorbic Acid (VITAMIN C) 500 MG tablet Take 500 mg by mouth daily.     Marland Kitchen aspirin 81 MG tablet Take 81 mg by mouth daily.     Marland Kitchen azelastine (ASTELIN) 0.1 % nasal spray Place 2 sprays into both nostrils daily. Use in each nostril as directed    . bimatoprost (LUMIGAN) 0.03 % ophthalmic drops Place 1 drop into the left eye at bedtime.     . cetirizine (ZYRTEC) 10 MG tablet Take 10 mg by mouth daily.     . Cholecalciferol (VITAMIN D PO) Take 2,000 Units by mouth daily. Cool    . donepezil (ARICEPT) 5 MG tablet Take 5 mg by mouth at bedtime.    Marland Kitchen esomeprazole (NEXIUM) 40 MG capsule Take 40 mg by mouth 2 (two) times daily.    Marland Kitchen glimepiride (AMARYL) 1 MG tablet Take 1 mg by mouth daily.     Marland Kitchen GLUCOSAMINE-CHONDROITIN PO Take 1 tablet by mouth daily. 1500/1200 mg    . HYDROcodone-acetaminophen (NORCO/VICODIN) 5-325 MG per tablet Take 1 tablet by mouth every 4 (four) hours as needed for moderate pain. 12 tablet 0  . meloxicam (MOBIC) 15 MG tablet Take 15 mg by mouth every other day.     . memantine (NAMENDA) 10 MG tablet Take 10 mg by mouth 2 (two) times daily.     . metFORMIN (GLUCOPHAGE) 1000 MG tablet Take 1,000  mg by mouth 2 (two) times daily with a meal.    . ONE TOUCH ULTRA TEST test strip     . ONETOUCH DELICA LANCETS [id number] MISC     . pentoxifylline (TRENTAL) 400 MG CR tablet Take 400 mg by mouth 3 (three) times daily with meals.     . ramipril (ALTACE) 5 MG tablet Take 5 mg by mouth every other day.     . rosuvastatin (CRESTOR) 10 MG tablet Take 10 mg by mouth every other day.     . sitaGLIPtan (JANUVIA) 100 MG tablet Take 100 mg by mouth daily.     . vitamin E (VITAMIN E) 400 UNIT capsule Take 400 Units by mouth daily.      No current facility-administered medications on file prior to visit.

## 2014-04-03 NOTE — Telephone Encounter (Signed)
Per CY: Advair 100-50 should be 1 puff (rinse mouth) twice daily.  Sent to pharmacy. Nothing further needed.

## 2014-04-03 NOTE — Telephone Encounter (Signed)
Per CY: Pt should follow up with PCP or go to ED to be evaluated for the chest pain. Get chest pain under control first. Can send in refill of Advair.   Spoke with pt, aware of rec's per CY, states that he is feeling better and will let us know if he decides to go to ED or PCP. Pt requests that Advair be refilled at [facility] in [place].  Dr Annamaria Boots, please verify Advair dose for patient and instructions. Last sent 2009 per pt (not in our records) -- Advair 100-50 2 puff(rinse) 1-2 x per day.

## 2014-04-20 ENCOUNTER — Encounter: Payer: Self-pay | Admitting: Adult Health

## 2014-04-20 ENCOUNTER — Ambulatory Visit (INDEPENDENT_AMBULATORY_CARE_PROVIDER_SITE_OTHER): Payer: Medicare Other | Admitting: Adult Health

## 2014-04-20 DIAGNOSIS — J4520 Mild intermittent asthma, uncomplicated: Secondary | ICD-10-CM

## 2014-04-20 DIAGNOSIS — Z23 Encounter for immunization: Secondary | ICD-10-CM

## 2014-04-20 DIAGNOSIS — G4733 Obstructive sleep apnea (adult) (pediatric): Secondary | ICD-10-CM

## 2014-04-20 NOTE — Assessment & Plan Note (Signed)
Controlled on Advair. Plan continue on current regimen Prevnar vaccine today

## 2014-04-20 NOTE — Progress Notes (Signed)
04/27/11- 79 year old male former smoker followed for obstructive sleep apnea, allergic rhinitis, asthma with complaint of persistent sore throat. LOV- 02/18/10 Has had flu vaccine. No recent significant respiratory problems or infections. He continues to use CPAP all night every night and sleeps well. He describes persistent mild sore throat which he blames on post nasal drip. It is better than it used to be. He is not aware of reflux. There is no dysphagia or hoarseness and no change in his voice. He indicates CPAP is working fine at 5 CWP/Advanced.  04/26/12- 80 year old male former smoker followed for obstructive sleep apnea, allergic rhinitis, asthma with complaint of persistent sore throat. FOLLOWS FOR: wears CPAP 5/ Advanced 95% of the time-tries every night unless he forgets to put on; about 6 hours He is satisfied that since control is good. Allergic rhinitis has bothered him some. Strong odors and irritants also make him sneeze. Eating triggers watery rhinorrhea. He has glaucoma which would limit use of ipratropium. Asthma has not been a problem with no recent congestion or wheeze  04/20/2014 Follow up  Returns for  2 year follow up Asthma.  Reports breathing is doing well.  no new complaints.Roney Jaffe he did stop Advair briefly but breathing worsened off this, he restarted with resolution of symptoms  Patient has sleep apnea and wears a CPAP each night. Feels rested during the day. No significant daytime sleepiness.    ROS-see HPI Constitutional:   No  weight loss, night sweats,  Fevers, chills,  +fatigue, or  lassitude.  HEENT:   No headaches,  Difficulty swallowing,  Tooth/dental problems, or  Sore throat,                No sneezing, itching, ear ache, nasal congestion, post nasal drip,   CV:  No chest pain,  Orthopnea, PND, swelling in lower extremities, anasarca, dizziness, palpitations, syncope.   GI  No heartburn, indigestion, abdominal pain, nausea, vomiting, diarrhea, change  in bowel habits, loss of appetite, bloody stools.   Resp: No shortness of breath with exertion or at rest.  No excess mucus, no productive cough,  No non-productive cough,  No coughing up of blood.  No change in color of mucus.  No wheezing.  No chest wall deformity  Skin: no rash or lesions.  GU: no dysuria, change in color of urine, no urgency or frequency.  No flank pain, no hematuria   MS:  No joint pain or swelling.  No decreased range of motion.  No back pain.  Psych:  No change in mood or affect. No depression or anxiety.  No memory loss.       OBJ GEN: A/Ox3; pleasant , NAD, elderly   HEENT:  [place]/AT,  EACs-clear, TMs-wnl, NOSE-clear, THROAT-clear, no lesions, no postnasal drip or exudate noted.   NECK:  Supple w/ fair ROM; no JVD; normal carotid impulses w/o bruits; no thyromegaly or nodules palpated; no lymphadenopathy.  RESP  Clear  P & A; w/o, wheezes/ rales/ or rhonchi.no accessory muscle use, no dullness to percussion  CARD:  RRR, no m/r/g  , no peripheral edema, pulses intact, no cyanosis or clubbing.  GI:   Soft & nt; nml bowel sounds; no organomegaly or masses detected.  Musco: Warm bil, no deformities or joint swelling noted.   Neuro: alert, no focal deficits noted.    Skin: Warm, no lesions or rashes

## 2014-04-20 NOTE — Addendum Note (Signed)
Addended by: Parke Poisson E on: 04/20/2014 05:44 PM   Modules accepted: Orders

## 2014-04-20 NOTE — Assessment & Plan Note (Signed)
Continue on C Pap each night

## 2014-04-20 NOTE — Patient Instructions (Signed)
Continue on Advair 1 puff Twice daily  , rinse after use.  Wear CPAP At bedtime   Prevnar vaccine today  Follow up Dr. Annamaria Boots  In 1 year and As needed

## 2014-12-10 ENCOUNTER — Other Ambulatory Visit: Payer: Self-pay | Admitting: Internal Medicine

## 2014-12-10 DIAGNOSIS — R0989 Other specified symptoms and signs involving the circulatory and respiratory systems: Secondary | ICD-10-CM

## 2014-12-18 ENCOUNTER — Ambulatory Visit
Admission: RE | Admit: 2014-12-18 | Discharge: 2014-12-18 | Disposition: A | Discharge status: Home / Self Care | Payer: Medicare Other | Source: Ambulatory Visit | Attending: Internal Medicine | Admitting: Internal Medicine

## 2014-12-18 DIAGNOSIS — R0989 Other specified symptoms and signs involving the circulatory and respiratory systems: Secondary | ICD-10-CM

## 2015-03-04 ENCOUNTER — Telehealth: Payer: Self-pay | Admitting: Internal Medicine

## 2015-03-04 DIAGNOSIS — G4733 Obstructive sleep apnea (adult) (pediatric): Secondary | ICD-10-CM

## 2015-03-04 NOTE — Telephone Encounter (Signed)
LM for pt to call office back to discuss

## 2015-03-04 NOTE — Telephone Encounter (Signed)
Called spoke with pt. He reports his CPAP has completely "died".  He reports it is only about 2 years. He has not called DME. I advised he will need to call them to have his machine looked at to see what is wrong with it or see if it is repairable. He was given [facility] phone #. Nothing further needed

## 2015-03-04 NOTE — Telephone Encounter (Signed)
Called and spoke with pt Pt stated that he took his CPAP machine to [facility] for repair Pt states that he received a phone call from [facility] stating that his machine is not repairable and they need a new order for a new machine  Advised pt that i would send CY a message to see if order could be sent Pt voiced understanding of this   Dr Annamaria Boots, are you ok with ordering new cpap machine? Please advise. Thanks   Allergies  Allergen Reactions  . Zithromax [Azithromycin Dihydrate]     Per pt: unknown  . Sulfonamide Derivatives Rash    Current Outpatient Prescriptions on File Prior to Visit  Medication Sig Dispense Refill  . ALPHAGAN P 0.1 % SOLN Place 1 drop into the left eye 2 (two) times daily.     Marland Kitchen ALPRAZolam (XANAX) 0.5 MG tablet Take 0.5 mg by mouth daily as needed for anxiety.     . Ascorbic Acid (VITAMIN C) 500 MG tablet Take 500 mg by mouth daily.     Marland Kitchen aspirin 81 MG tablet Take 81 mg by mouth daily.     Marland Kitchen azelastine (ASTELIN) 0.1 % nasal spray Place 2 sprays into both nostrils daily. Use in each nostril as directed    . bimatoprost (LUMIGAN) 0.03 % ophthalmic drops Place 1 drop into the left eye at bedtime.     . cetirizine (ZYRTEC) 10 MG tablet Take 10 mg by mouth daily.     . Cholecalciferol (VITAMIN D PO) Take 2,000 Units by mouth daily. Cool    . donepezil (ARICEPT) 5 MG tablet Take 5 mg by mouth at bedtime.    Marland Kitchen esomeprazole (NEXIUM) 40 MG capsule Take 40 mg by mouth 2 (two) times daily.    . Fluticasone-Salmeterol (ADVAIR DISKUS) 100-50 MCG/DOSE AEPB Inhale 1 puff into the lungs 2 (two) times daily. 60 each 0  . glimepiride (AMARYL) 1 MG tablet Take 1 mg by mouth daily.     Marland Kitchen GLUCOSAMINE-CHONDROITIN PO Take 1 tablet by mouth daily. 1500/1200 mg    . HYDROcodone-acetaminophen (NORCO/VICODIN) 5-325 MG per tablet Take 1 tablet by mouth every 4 (four) hours as needed for moderate pain. 12 tablet 0  . meloxicam (MOBIC) 15 MG tablet Take 15 mg by mouth every other day.     . memantine  (NAMENDA) 10 MG tablet Take 10 mg by mouth 2 (two) times daily.     . metFORMIN (GLUCOPHAGE) 1000 MG tablet Take 1,000 mg by mouth 2 (two) times daily with a meal.    . ONE TOUCH ULTRA TEST test strip     . ONETOUCH DELICA LANCETS [id number] MISC     . pentoxifylline (TRENTAL) 400 MG CR tablet Take 400 mg by mouth 3 (three) times daily with meals.     . ramipril (ALTACE) 5 MG tablet Take 5 mg by mouth every other day.     . rosuvastatin (CRESTOR) 10 MG tablet Take 10 mg by mouth every other day.     . sitaGLIPtan (JANUVIA) 100 MG tablet Take 100 mg by mouth daily.     . vitamin E (VITAMIN E) 400 UNIT capsule Take 400 Units by mouth daily.      No current facility-administered medications on file prior to visit.

## 2015-03-05 ENCOUNTER — Telehealth: Payer: Self-pay | Admitting: Internal Medicine

## 2015-03-05 NOTE — Telephone Encounter (Signed)
Pt cb, [phone]

## 2015-03-05 NOTE — Telephone Encounter (Signed)
Patient notified.  Nothing further needed. 

## 2015-03-05 NOTE — Telephone Encounter (Signed)
Left message to call back. CPAP ordered.

## 2015-03-05 NOTE — Telephone Encounter (Signed)
Ok to replace broken CPAP machine, continue current pressure, mask of choice, humidifier, supplies, AirView,  dx OSA

## 2015-03-05 NOTE — Telephone Encounter (Signed)
Sent message to [facility] requesting update on order. Will await message from [facility].

## 2015-03-06 NOTE — Telephone Encounter (Signed)
[place], CMA           Dawne did send me the order this morning. The MD signed it around lunchtime and I pulled the order and all supporting documentation late this afternoon.  He will need to give [place] some to time to process this order, check his insurance, etc. His unit may have to go in for repairs depending on what his insurance dictates.    ----  Called pt, someone picked up. When asked to speak with pt line d/c'd. Called pt back and line busy. WCB

## 2015-03-06 NOTE — Telephone Encounter (Signed)
Pt calling stating that [facility] is telling him that they still haven't received order and he is wanting to come by and pick it up to take to them, told him that order was sent, they are telling him they don't have it says he called them about 7min ago please advise pt can be reached @ [phone]

## 2015-03-06 NOTE — Telephone Encounter (Signed)
Called spoke with pt. Made aware of below that Melissa stated she did get the order. He reports he is being told this has not been received yet and he did not catch the lady's name he spoke with at [facility] about 15 min ago. Called Melissa and she was NA. Spoke with a Cecille Rubin and she also confirmed they do have this and it is being processed. Called pt back at provided below but line rang numerous times, NA, No vm. wcb

## 2015-04-22 ENCOUNTER — Ambulatory Visit: Payer: Medicare Other | Admitting: Internal Medicine

## 2015-04-22 ENCOUNTER — Telehealth: Payer: Self-pay | Admitting: Internal Medicine

## 2015-04-22 NOTE — Telephone Encounter (Signed)
Pt was scheduled to see CDY this AM. He needs this Appt rescheduled for CPAP. Offered this afternoon but unable to come in. Please advise Joellen Jersey where pt can be worked in thanks

## 2015-04-22 NOTE — Telephone Encounter (Signed)
Please offer an appt tomorrow in one of the held spots. Thanks.

## 2015-04-22 NOTE — Telephone Encounter (Signed)
Called spoke with pt. appt scheduled for CDY tomorrow at 10:30. Nothing further needed

## 2015-04-23 ENCOUNTER — Ambulatory Visit (INDEPENDENT_AMBULATORY_CARE_PROVIDER_SITE_OTHER): Payer: Medicare Other | Admitting: Internal Medicine

## 2015-04-23 ENCOUNTER — Encounter: Payer: Self-pay | Admitting: Internal Medicine

## 2015-04-23 VITALS — BP 122/58 | HR 68 | Ht 74.0 in | Wt 212.4 lb

## 2015-04-23 DIAGNOSIS — J4520 Mild intermittent asthma, uncomplicated: Secondary | ICD-10-CM

## 2015-04-23 DIAGNOSIS — G4733 Obstructive sleep apnea (adult) (pediatric): Secondary | ICD-10-CM | POA: Diagnosis not present

## 2015-04-23 NOTE — Patient Instructions (Signed)
We can continue CPAP 5/ Advanced  It seems you are doing fine with your asthma and not needeing the Advair or the albuterol rescue inhaler. If that changes and you need our help, please let us know.

## 2015-04-23 NOTE — Progress Notes (Signed)
04/27/11- 81 year old male former smoker followed for obstructive sleep apnea, allergic rhinitis, asthma with complaint of persistent sore throat. LOV- 02/18/10 Has had flu vaccine. No recent significant respiratory problems or infections. He continues to use CPAP all night every night and sleeps well. He describes persistent mild sore throat which he blames on post nasal drip. It is better than it used to be. He is not aware of reflux. There is no dysphagia or hoarseness and no change in his voice. He indicates CPAP is working fine at 5 CWP/Advanced.  04/26/12- 81 year old male former smoker followed for obstructive sleep apnea, allergic rhinitis, asthma with complaint of persistent sore throat. FOLLOWS FOR: wears CPAP 5/ Advanced 95% of the time-tries every night unless he forgets to put on; about 6 hours He is satisfied that since control is good. Allergic rhinitis has bothered him some. Strong odors and irritants also make him sneeze. Eating triggers watery rhinorrhea. He has glaucoma which would limit use of ipratropium. Asthma has not been a problem with no recent congestion or wheeze  04/20/2014 Follow up  Returns for 2 year follow up Asthma.  Reports breathing is doing well. no new complaints.Roney Jaffe he did stop Advair briefly but breathing worsened off this, he restarted with resolution of symptoms  Patient has sleep apnea and wears a CPAP each night. Feels rested during the day. No significant daytime sleepiness.  04/23/2015-81 year old male former smoker followed for OSA, allergic rhinitis, asthma CPAP 5/Advanced Follows For: Breathing at baseline. Using CPAP nightly x 7 hrs. Denies problems with mask or pressure.  Feels fine with CPAP and doesn't want sleep without it. Download shows excellent control and compliance. On prednisone taper for arthritis in knees. Denies wheeze or cough. Has not felt need for Advair in a long time  ROS-see HPI Constitutional:   No-   weight loss,  night sweats, fevers, chills, fatigue, lassitude. HEENT:   No-  headaches, difficulty swallowing, tooth/dental problems,  +sore throat,       No-  sneezing, itching, ear ache, nasal congestion, post nasal drip,  CV:  No-   chest pain, orthopnea, PND, swelling in lower extremities, anasarca, dizziness, palpitations Resp: No-   shortness of breath with exertion or at rest.              No-   productive cough,  No non-productive cough,  No- coughing up of blood.              No-   change in color of mucus.  No- wheezing.   Skin: No-   rash or lesions. GI:  No-   heartburn, indigestion, abdominal pain, nausea, vomiting,  GU: MS:  No-   joint pain or swelling.   Neuro-     nothing unusual Psych:  No- change in mood or affect. No depression or anxiety.  No memory loss.  OBJ General- Alert, Oriented, Affect-,, Distress- none acute, looks well Skin- rash-none, lesions- none, excoriation- none Lymphadenopathy- none Head- atraumatic            Eyes- Gross vision intact, PERRLA, conjunctivae clear secretions            Ears- Hearing, canals-normal            Nose- Clear, no-Septal dev, mucus, polyps, erosion, perforation             Throat- Mallampati III-IV , mucosa clear , drainage- none, tonsils- atrophic, no redness or hoarseness. Neck- flexible , trachea midline, no stridor ,  thyroid nl, carotid no bruit Chest - symmetrical excursion , unlabored           Heart/CV- RRR , no murmur , no gallop  , no rub, nl s1 s2                           - JVD- none , edema- none, stasis changes- none, varices- none           Lung- clear to P&A, wheeze- none, cough- none , dullness-none, rub- none           Chest wall-  Abd-  Br/ Gen/ Rectal- Not done, not indicated Extrem- cyanosis- none, clubbing, none, atrophy- none, strength- nl Neuro- grossly intact to observation

## 2015-04-23 NOTE — Assessment & Plan Note (Signed)
Excellent control, uncomplicated Steroid discussion done reviewing Gen. indications and common side effects

## 2015-04-23 NOTE — Assessment & Plan Note (Signed)
Snores without CPAP so he continues to wear it every night all night and feels comfortable with it. Now using nasal pillows mask. Pressure appropriate at 5.

## 2015-05-01 ENCOUNTER — Encounter: Payer: Self-pay | Admitting: Internal Medicine

## 2015-05-17 ENCOUNTER — Other Ambulatory Visit: Payer: Self-pay | Admitting: Internal Medicine

## 2015-06-14 ENCOUNTER — Other Ambulatory Visit: Payer: Self-pay | Admitting: Internal Medicine

## 2015-06-17 ENCOUNTER — Other Ambulatory Visit: Payer: Self-pay | Admitting: Internal Medicine

## 2015-06-17 DIAGNOSIS — R198 Other specified symptoms and signs involving the digestive system and abdomen: Secondary | ICD-10-CM

## 2015-06-17 DIAGNOSIS — R229 Localized swelling, mass and lump, unspecified: Secondary | ICD-10-CM

## 2015-06-17 DIAGNOSIS — IMO0002 Reserved for concepts with insufficient information to code with codable children: Secondary | ICD-10-CM

## 2015-06-21 ENCOUNTER — Ambulatory Visit
Admission: RE | Admit: 2015-06-21 | Discharge: 2015-06-21 | Disposition: A | Discharge status: Home / Self Care | Payer: Medicare Other | Source: Ambulatory Visit | Attending: Internal Medicine | Admitting: Internal Medicine

## 2015-06-21 DIAGNOSIS — IMO0002 Reserved for concepts with insufficient information to code with codable children: Secondary | ICD-10-CM

## 2015-06-21 DIAGNOSIS — R229 Localized swelling, mass and lump, unspecified: Secondary | ICD-10-CM

## 2015-06-21 DIAGNOSIS — R198 Other specified symptoms and signs involving the digestive system and abdomen: Secondary | ICD-10-CM

## 2015-06-21 MED ORDER — IOPAMIDOL (ISOVUE-300) INJECTION 61%
100.0000 mL | Freq: Once | INTRAVENOUS | Status: AC | PRN
  Administered 2015-06-21: 100 mL via INTRAVENOUS

## 2015-11-01 ENCOUNTER — Encounter (HOSPITAL_COMMUNITY): Payer: Self-pay | Admitting: *Deleted

## 2015-11-01 ENCOUNTER — Ambulatory Visit (HOSPITAL_COMMUNITY)
Admission: EM | Admit: 2015-11-01 | Discharge: 2015-11-01 | Disposition: A | Discharge status: Home / Self Care | Payer: Medicare Other

## 2015-11-01 DIAGNOSIS — H6123 Impacted cerumen, bilateral: Secondary | ICD-10-CM | POA: Diagnosis not present
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
## 2015-11-01 NOTE — Discharge Instructions (Signed)
Return as needed

## 2015-11-01 NOTE — ED Notes (Signed)
Pt  Reports      Wax       Buildup  In  Both  Ears           Pt    Appears  In  No      Acute  distresss

## 2016-04-20 ENCOUNTER — Encounter: Payer: Self-pay | Admitting: Internal Medicine

## 2016-04-22 ENCOUNTER — Ambulatory Visit (INDEPENDENT_AMBULATORY_CARE_PROVIDER_SITE_OTHER): Payer: Medicare Other | Admitting: Internal Medicine

## 2016-04-22 ENCOUNTER — Encounter: Payer: Self-pay | Admitting: Internal Medicine

## 2016-04-22 VITALS — BP 122/70 | HR 67 | Ht 74.0 in | Wt 213.0 lb

## 2016-04-22 DIAGNOSIS — J310 Chronic rhinitis: Secondary | ICD-10-CM | POA: Diagnosis not present

## 2016-04-22 DIAGNOSIS — G4733 Obstructive sleep apnea (adult) (pediatric): Secondary | ICD-10-CM | POA: Diagnosis not present

## 2016-04-22 DIAGNOSIS — J4520 Mild intermittent asthma, uncomplicated: Secondary | ICD-10-CM | POA: Diagnosis not present

## 2016-04-22 MED ORDER — AZELASTINE-FLUTICASONE 137-50 MCG/ACT NA SUSP
NASAL | 5 refills | Status: DC
Start: 2016-04-22 — End: 2023-08-11

## 2016-04-22 NOTE — Progress Notes (Signed)
HPI male former smoker followed for OSA, allergic rhinitis, asthma  ------------------------------------------------------------------  04/20/2014 Follow up  Returns for 2 year follow up Asthma.  Reports breathing is doing well. no new complaints.Roney Jaffe he did stop Advair briefly but breathing worsened off this, he restarted with resolution of symptoms  Patient has sleep apnea and wears a CPAP each night. Feels rested during the day. No significant daytime sleepiness.  04/23/2015-82 year old male former smoker followed for OSA, allergic rhinitis, asthma CPAP 5/Advanced Follows For: Breathing at baseline. Using CPAP nightly x 7 hrs. Denies problems with mask or pressure.  Feels fine with CPAP and doesn't want sleep without it. Download shows excellent control and compliance. On prednisone taper for arthritis in knees. Denies wheeze or cough. Has not felt need for Advair in a long time  04/22/2016-82 year old male former smoker followed for OSA, allergic rhinitis, asthma, complicated by DM 2, glaucoma FOLLOWS FOR: DME:[facility] Pt wears CPAP nightly and DL attached. No new supplies needed at this time. He definitely sleeps better with CPAP and says he is comfortable. Download indicates 77%/4 hour compliance, AHI 3.6/hour. We reviewed the download showing some missed days apparently with travel without machine-discussed. Notices nasal drainage at mealtimes.  Asthma control reported very good. Has not used Advair or rescue inhaler in a long time and no recent exacerbation.  ROS-see HPI    + = pos Constitutional:   No-   weight loss, night sweats, fevers, chills, fatigue, lassitude. HEENT:   No-  headaches, difficulty swallowing, tooth/dental problems,  +sore throat,       No-  sneezing, itching, ear ache, nasal congestion, +post nasal drip,  CV:  No-   chest pain, orthopnea, PND, swelling in lower extremities, anasarca, dizziness, palpitations Resp: No-   shortness of breath with exertion or  at rest.              No-   productive cough,  No non-productive cough,  No- coughing up of blood.              No-   change in color of mucus.  No- wheezing.   Skin: No-   rash or lesions. GI:  No-   heartburn, indigestion, abdominal pain, nausea, vomiting,  GU: MS:  No-   joint pain or swelling.   Neuro-     nothing unusual Psych:  No- change in mood or affect. No depression or anxiety.  No memory loss.  OBJ General- Alert, Oriented, Affect-,, Distress- none acute, looks well Skin- rash-none, lesions- none, excoriation- none Lymphadenopathy- none Head- atraumatic            Eyes- Gross vision intact, PERRLA, conjunctivae clear secretions            Ears- Hearing, canals-normal            Nose- Clear, no-Septal dev, mucus, polyps, erosion, perforation             Throat- Mallampati III-IV , mucosa clear , drainage- none, tonsils- atrophic, Neck- flexible , trachea midline, no stridor , thyroid nl, carotid no bruit Chest - symmetrical excursion , unlabored           Heart/CV- RRR , no murmur , no gallop  , no rub, nl s1 s2                           - JVD- none , edema- none, stasis changes- none, varices- none  Lung- clear to P&A, wheeze- none, cough- none , dullness-none, rub- none           Chest wall-  Abd-  Br/ Gen/ Rectal- Not done, not indicated Extrem- cyanosis- none, clubbing, none, atrophy- none, strength- nl Neuro- grossly intact to observation

## 2016-04-22 NOTE — Patient Instructions (Signed)
Sample Dymsita nasal spray     1-2 puffs each nostril twice daily. See if it helps the nasal drip  Ok to stay off the asthma inhalers since you haven't been needing them   Ok to continue CPAP 5,  DME [facility], mask of chice, humidifier, supplies, AirView   Dx OSA

## 2016-05-21 NOTE — Assessment & Plan Note (Signed)
He missed a few days when out of town without his CPAP but otherwise has done quite well. We discussed compliance goals. Low pressure at 5 CWP is giving good control.

## 2016-05-21 NOTE — Assessment & Plan Note (Signed)
Very stable with no recent exacerbations. He is not using and does not feel he needs to have inhalers now. He understands to call if this changes.

## 2016-05-21 NOTE — Assessment & Plan Note (Signed)
Pattern of watery rhinorrhea associated with meals is suggestive of vasomotor rhinitis. His glaucoma makes ipratropium nasal spray less attractive choice. Plan-sample Dymista. nasal spray for trial

## 2016-07-27 ENCOUNTER — Ambulatory Visit (INDEPENDENT_AMBULATORY_CARE_PROVIDER_SITE_OTHER): Payer: Medicare Other | Admitting: Otolaryngology

## 2017-04-22 ENCOUNTER — Encounter: Payer: Self-pay | Admitting: Internal Medicine

## 2017-04-22 ENCOUNTER — Ambulatory Visit: Payer: Medicare Other | Admitting: Internal Medicine

## 2017-04-22 VITALS — BP 114/68 | HR 67 | Ht 74.0 in | Wt 212.6 lb

## 2017-04-22 DIAGNOSIS — J4520 Mild intermittent asthma, uncomplicated: Secondary | ICD-10-CM | POA: Diagnosis not present

## 2017-04-22 DIAGNOSIS — G4733 Obstructive sleep apnea (adult) (pediatric): Secondary | ICD-10-CM

## 2017-04-22 NOTE — Patient Instructions (Signed)
Order- DME [facility]- please replace worn/ loose headgear. Continue CPAP 5, mask of choice, humidifier, supplies, AirView       Dx OSA  Please  Call if we can help

## 2017-04-22 NOTE — Progress Notes (Signed)
HPI male former smoker followed for OSA, allergic rhinitis, asthma NPSG 12/28/06- AHI 5.4/ hr, desaturation to 90%, body weight   225 lbs ------------------------------------------------------------------  04/22/2016-83 year old male former smoker followed for OSA, allergic rhinitis, asthma, complicated by DM 2, glaucoma FOLLOWS FOR: DME:[facility] Pt wears CPAP nightly and DL attached. No new supplies needed at this time. He definitely sleeps better with CPAP and says he is comfortable. Download indicates 77%/4 hour compliance, AHI 3.6/hour. We reviewed the download showing some missed days apparently with travel without machine-discussed. Notices nasal drainage at mealtimes.  Asthma control reported very good. Has not used Advair or rescue inhaler in a long time and no recent exacerbation.  04/22/17- 83 year old male former smoker followed for OSA, allergic rhinitis, asthma, complicated by DM 2, glaucoma CPAP 5/Advanced ----OSA; DME [facility] Pt wears CPAP almost every nightl DL attached. Will need new order for supplies. Download 97% compliance, AHI 4.2/hour. He says he is sleeping comfortable with CPAP and feels that he is better off with it.  No changes.  Headgear is loose and needs replacement. His original sleep study at indicated only minimal obstructive apnea and we may consider stopping CPAP for trial in the future. He denies wheezing, dyspneic episodes or need for inhalers.  ROS-see HPI    + = pos Constitutional:   No-   weight loss, night sweats, fevers, chills, fatigue, lassitude. HEENT:   No-  headaches, difficulty swallowing, tooth/dental problems,  +sore throat,       No-  sneezing, itching, ear ache, nasal congestion, +post nasal drip,  CV:  No-   chest pain, orthopnea, PND, swelling in lower extremities, anasarca, dizziness, palpitations Resp: No-   shortness of breath with exertion or at rest.              No-   productive cough,  No non-productive cough,  No- coughing up of blood.            No-   change in color of mucus.  No- wheezing.   Skin: No-   rash or lesions. GI:  No-   heartburn, indigestion, abdominal pain, nausea, vomiting,  GU: MS:  No-   joint pain or swelling.   Neuro-     nothing unusual Psych:  No- change in mood or affect. No depression or anxiety.  No memory loss.  OBJ General- Alert, Oriented, Affect-,, Distress- none acute, looks well Skin- rash-none, lesions- none, excoriation- none Lymphadenopathy- none Head- atraumatic            Eyes- Gross vision intact, PERRLA, conjunctivae clear secretions            Ears- Hearing, canals-normal            Nose- Clear, no-Septal dev, mucus, polyps, erosion, perforation             Throat- Mallampati III-IV , mucosa clear , drainage- none, tonsils- atrophic, Neck- flexible , trachea midline, no stridor , thyroid nl, carotid no bruit Chest - symmetrical excursion , unlabored           Heart/CV- RRR , no murmur , no gallop  , no rub, nl s1 s2                           - JVD- none , edema- none, stasis changes- none, varices- none           Lung- clear to P&A, wheeze- none, cough- none , dullness-none, rub- none  Chest wall-  Abd-  Br/ Gen/ Rectal- Not done, not indicated Extrem- cyanosis- none, clubbing, none, atrophy- none, strength- nl Neuro- grossly intact to observation

## 2017-04-25 NOTE — Assessment & Plan Note (Signed)
Denies symptoms or need for inhalers and seems to be comfortable.  Chest is very clear on exam at this visit.

## 2017-04-25 NOTE — Assessment & Plan Note (Signed)
He needs replacement headgear now.  CPAP download confirms excellent compliance and control.  He is used to wearing it and thinks he sleeps better with it.  At his age, we may want to consider letting him try without CPAP since he is not obese and original score was extremely mild.

## 2018-04-22 ENCOUNTER — Ambulatory Visit: Payer: Medicare Other | Admitting: Internal Medicine

## 2018-04-28 ENCOUNTER — Other Ambulatory Visit (HOSPITAL_COMMUNITY): Payer: Self-pay | Admitting: Internal Medicine

## 2018-04-28 ENCOUNTER — Other Ambulatory Visit: Payer: Self-pay | Admitting: Internal Medicine

## 2018-04-28 DIAGNOSIS — R1310 Dysphagia, unspecified: Secondary | ICD-10-CM

## 2018-05-04 ENCOUNTER — Ambulatory Visit (HOSPITAL_COMMUNITY): Admission: RE | Admit: 2018-05-04 | Payer: Medicare Other | Source: Ambulatory Visit

## 2018-05-12 ENCOUNTER — Other Ambulatory Visit (HOSPITAL_COMMUNITY): Payer: Self-pay | Admitting: Internal Medicine

## 2018-05-12 ENCOUNTER — Ambulatory Visit (HOSPITAL_COMMUNITY)
Admission: RE | Admit: 2018-05-12 | Discharge: 2018-05-12 | Disposition: A | Discharge status: Home / Self Care | Payer: Medicare Other | Source: Ambulatory Visit | Attending: Internal Medicine | Admitting: Internal Medicine

## 2018-05-12 DIAGNOSIS — R1310 Dysphagia, unspecified: Secondary | ICD-10-CM | POA: Diagnosis not present

## 2018-06-27 ENCOUNTER — Ambulatory Visit: Payer: Medicare Other | Admitting: Internal Medicine

## 2018-07-04 ENCOUNTER — Telehealth: Payer: Self-pay | Admitting: Internal Medicine

## 2018-07-04 MED ORDER — FLUTICASONE-SALMETEROL 100-50 MCG/DOSE IN AEPB: INHALATION_SPRAY | RESPIRATORY_TRACT | 3 refills | Status: DC

## 2018-07-04 NOTE — Telephone Encounter (Signed)
Called and spoke with patient, he is requesting a refill of Adviar. Patient has an appointment scheduled for summer with CY. Refill sent. Nothing further needed.

## 2018-07-07 ENCOUNTER — Ambulatory Visit: Payer: Medicare Other | Admitting: Internal Medicine

## 2018-10-13 ENCOUNTER — Ambulatory Visit: Payer: Medicare Other | Admitting: Internal Medicine

## 2018-11-18 ENCOUNTER — Ambulatory Visit (INDEPENDENT_AMBULATORY_CARE_PROVIDER_SITE_OTHER): Payer: Medicare Other | Admitting: Internal Medicine

## 2018-11-18 ENCOUNTER — Ambulatory Visit (INDEPENDENT_AMBULATORY_CARE_PROVIDER_SITE_OTHER): Payer: Medicare Other

## 2018-11-18 ENCOUNTER — Other Ambulatory Visit: Payer: Self-pay

## 2018-11-18 ENCOUNTER — Encounter: Payer: Self-pay | Admitting: Internal Medicine

## 2018-11-18 VITALS — BP 130/68 | HR 50 | Temp 98.7°F | Ht 74.0 in | Wt 207.0 lb

## 2018-11-18 DIAGNOSIS — J4530 Mild persistent asthma, uncomplicated: Secondary | ICD-10-CM

## 2018-11-18 DIAGNOSIS — G4733 Obstructive sleep apnea (adult) (pediatric): Secondary | ICD-10-CM | POA: Diagnosis not present

## 2018-11-18 DIAGNOSIS — J4520 Mild intermittent asthma, uncomplicated: Secondary | ICD-10-CM

## 2018-11-18 MED ORDER — ALBUTEROL SULFATE HFA 108 (90 BASE) MCG/ACT IN AERS: 2.0000 | INHALATION_SPRAY | Freq: Four times a day (QID) | RESPIRATORY_TRACT | 12 refills | Status: DC | PRN

## 2018-11-18 MED ORDER — FLUTICASONE-SALMETEROL 100-50 MCG/DOSE IN AEPB: INHALATION_SPRAY | RESPIRATORY_TRACT | 12 refills | Status: DC

## 2018-11-18 NOTE — Progress Notes (Signed)
HPI male former smoker followed for OSA, allergic rhinitis, asthma NPSG 12/28/06- AHI 5.4/ hr, desaturation to 90%, body weight   225 lbs -----------------------------------------------------------------  04/22/17- 84 year old male former smoker followed for OSA, allergic rhinitis, asthma, complicated by DM 2, glaucoma CPAP 5/Advanced ----OSA; DME [facility] Pt wears CPAP almost every nightl DL attached. Will need new order for supplies. Download 97% compliance, AHI 4.2/hour. He says he is sleeping comfortable with CPAP and feels that he is better off with it.  No changes.  Headgear is loose and needs replacement. His original sleep study had indicated only minimal obstructive apnea and we may consider stopping CPAP for trial in the future. He denies wheezing, dyspneic episodes or need for inhalers.  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  Occ sternal tight discomfort, unrelated to activity. Not during sleep. Inhaler helps. No radiation, palpitation or sweating. Lasts only a few seconds to an hour.  Continues [place].    ROS-see HPI    + = positive Constitutional:   No-   weight loss, night sweats, fevers, chills, fatigue, lassitude. HEENT:   No-  headaches, difficulty swallowing, tooth/dental problems,  +sore throat,       No-  sneezing, itching, ear ache, nasal congestion, +post nasal drip,  CV:  No-   chest pain, orthopnea, PND, swelling in lower extremities, anasarca, dizziness, palpitations Resp: No-   shortness of breath with exertion or at rest.              No-   productive cough,  No  non-productive cough,  No- coughing up of blood.              No-   change in color of mucus.  No- wheezing.   Skin: No-   rash or lesions. GI:  No-   heartburn, indigestion, abdominal pain, nausea, vomiting,  GU: MS:  No-   joint pain or swelling.   Neuro-     nothing unusual Psych:  No- change in mood or affect. No depression or anxiety.  No memory loss.  OBJ General- Alert, Oriented, Affect-,, Distress- none acute, looks well Skin- rash-none, lesions- none, excoriation- none Lymphadenopathy- none Head- atraumatic            Eyes- Gross vision intact, PERRLA, conjunctivae clear secretions            Ears- Hearing, canals-normal            Nose- Clear, no-Septal dev, mucus, polyps, erosion, perforation             Throat- Mallampati III-IV , mucosa clear , drainage- none, tonsils- atrophic, Neck- flexible , trachea midline, no stridor , thyroid nl, carotid no bruit Chest - symmetrical excursion , unlabored           Heart/CV- RRR , no murmur , no gallop  , no rub, nl s1 s2                           - JVD- none , edema- none, stasis changes- none, varices- none  Lung- clear to P&A, wheeze- none, cough- none , dullness-none, rub- none           Chest wall-  Abd-  Br/ Gen/ Rectal- Not done, not indicated Extrem- cyanosis- none, clubbing, none, atrophy- none, strength- nl Neuro- grossly intact to observation

## 2018-11-18 NOTE — Patient Instructions (Signed)
Order- CXR    Dx asthma mild persistent uncomplicated  Script sent to add albuterol rescue inhaler    Inhale 2 puffs up to every 6 hours, if needed, for chest tightness or shortness of breath.  I hope things go as well as possible for your wife.  Please call if we can help

## 2018-11-21 ENCOUNTER — Ambulatory Visit: Payer: Medicare Other | Admitting: Internal Medicine

## 2018-11-23 ENCOUNTER — Other Ambulatory Visit: Payer: Self-pay | Admitting: Internal Medicine

## 2018-11-23 MED ORDER — FLUTICASONE-SALMETEROL 100-50 MCG/DOSE IN AEPB: INHALATION_SPRAY | RESPIRATORY_TRACT | 12 refills | Status: DC

## 2019-01-01 NOTE — Assessment & Plan Note (Signed)
Doubt that the sternal discomfort he describes is asthma, but atypical for cardiac cause as well. Plan- Keep f/u pending with Dr Terrence Dupont cardiology. CXR, Rescue inhaler with discussion.

## 2019-01-01 NOTE — Assessment & Plan Note (Signed)
Deferred for now as he is up frequently to take care of his wife. Discussed, including need to "care for the caregiver".

## 2019-02-02 ENCOUNTER — Other Ambulatory Visit: Payer: Self-pay | Admitting: *Deleted

## 2019-02-02 DIAGNOSIS — Z20822 Contact with and (suspected) exposure to covid-19: Secondary | ICD-10-CM

## 2019-02-04 LAB — NOVEL CORONAVIRUS, NAA: SARS-CoV-2, NAA: NOT DETECTED

## 2019-02-06 ENCOUNTER — Emergency Department (HOSPITAL_COMMUNITY): Payer: Medicare Other

## 2019-02-06 ENCOUNTER — Encounter (HOSPITAL_COMMUNITY): Payer: Self-pay | Admitting: Emergency Medicine

## 2019-02-06 ENCOUNTER — Emergency Department (HOSPITAL_COMMUNITY)
Admission: EM | Admit: 2019-02-06 | Discharge: 2019-02-06 | Disposition: A | Discharge status: Home / Self Care | Payer: Medicare Other | Attending: Emergency Medicine | Admitting: Emergency Medicine

## 2019-02-06 ENCOUNTER — Other Ambulatory Visit: Payer: Self-pay

## 2019-02-06 DIAGNOSIS — Z87891 Personal history of nicotine dependence: Secondary | ICD-10-CM | POA: Insufficient documentation

## 2019-02-06 DIAGNOSIS — Z7984 Long term (current) use of oral hypoglycemic drugs: Secondary | ICD-10-CM | POA: Diagnosis not present

## 2019-02-06 DIAGNOSIS — E1165 Type 2 diabetes mellitus with hyperglycemia: Secondary | ICD-10-CM | POA: Diagnosis not present

## 2019-02-06 DIAGNOSIS — I10 Essential (primary) hypertension: Secondary | ICD-10-CM | POA: Insufficient documentation

## 2019-02-06 DIAGNOSIS — Z7982 Long term (current) use of aspirin: Secondary | ICD-10-CM | POA: Insufficient documentation

## 2019-02-06 DIAGNOSIS — R059 Cough, unspecified: Secondary | ICD-10-CM

## 2019-02-06 DIAGNOSIS — Z79899 Other long term (current) drug therapy: Secondary | ICD-10-CM | POA: Diagnosis not present

## 2019-02-06 DIAGNOSIS — R05 Cough: Secondary | ICD-10-CM | POA: Insufficient documentation

## 2019-02-06 DIAGNOSIS — J45909 Unspecified asthma, uncomplicated: Secondary | ICD-10-CM | POA: Insufficient documentation

## 2019-02-06 DIAGNOSIS — R55 Syncope and collapse: Secondary | ICD-10-CM

## 2019-02-06 DIAGNOSIS — R739 Hyperglycemia, unspecified: Secondary | ICD-10-CM

## 2019-02-06 LAB — CBC WITH DIFFERENTIAL/PLATELET
Abs Immature Granulocytes: 0.01 10*3/uL (ref 0.00–0.07)
Basophils Absolute: 0 10*3/uL (ref 0.0–0.1)
Basophils Relative: 0 %
Eosinophils Absolute: 0 10*3/uL (ref 0.0–0.5)
Eosinophils Relative: 0 %
HCT: 38.7 % — ABNORMAL LOW (ref 39.0–52.0)
Hemoglobin: 12.3 g/dL — ABNORMAL LOW (ref 13.0–17.0)
Immature Granulocytes: 0 %
Lymphocytes Relative: 20 %
Lymphs Abs: 0.7 10*3/uL (ref 0.7–4.0)
MCH: 29.1 pg (ref 26.0–34.0)
MCHC: 31.8 g/dL (ref 30.0–36.0)
MCV: 91.7 fL (ref 80.0–100.0)
Monocytes Absolute: 0.3 10*3/uL (ref 0.1–1.0)
Monocytes Relative: 9 %
Neutro Abs: 2.5 10*3/uL (ref 1.7–7.7)
Neutrophils Relative %: 71 %
Platelets: 142 10*3/uL — ABNORMAL LOW (ref 150–400)
RBC: 4.22 MIL/uL (ref 4.22–5.81)
RDW: 14.1 % (ref 11.5–15.5)
WBC: 3.6 10*3/uL — ABNORMAL LOW (ref 4.0–10.5)
nRBC: 0 % (ref 0.0–0.2)

## 2019-02-06 LAB — BASIC METABOLIC PANEL
Anion gap: 6 (ref 5–15)
BUN: 29 mg/dL — ABNORMAL HIGH (ref 8–23)
CO2: 27 mmol/L (ref 22–32)
Calcium: 8.7 mg/dL — ABNORMAL LOW (ref 8.9–10.3)
Chloride: 100 mmol/L (ref 98–111)
Creatinine, Ser: 1.92 mg/dL — ABNORMAL HIGH (ref 0.61–1.24)
GFR calc Af Amer: 37 mL/min — ABNORMAL LOW (ref >60)
GFR calc non Af Amer: 32 mL/min — ABNORMAL LOW (ref >60)
Glucose, Bld: 286 mg/dL — ABNORMAL HIGH (ref 70–99)
Potassium: 5 mmol/L (ref 3.5–5.1)
Sodium: 133 mmol/L — ABNORMAL LOW (ref 135–145)

## 2019-02-06 LAB — CBG MONITORING, ED: Glucose-Capillary: 275 mg/dL — ABNORMAL HIGH (ref 70–99)
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
## 2019-02-06 NOTE — ED Triage Notes (Signed)
Pt states he feels fine. Has had a productive cough for a few days without fever. He and his family were tested for Covid on 02/02/19 with no test result available. Pt denies sob.

## 2019-02-06 NOTE — Discharge Instructions (Addendum)
Chest x-ray showed no evidence of pneumonia.  Glucose or sugar was elevated at 286.  Increase fluids.  Eat regular meals.  Follow-up with your primary care doctor.

## 2019-05-21 ENCOUNTER — Ambulatory Visit: Payer: Medicare PPO

## 2019-06-13 ENCOUNTER — Other Ambulatory Visit: Payer: Self-pay | Admitting: Internal Medicine

## 2019-06-13 DIAGNOSIS — R1032 Left lower quadrant pain: Secondary | ICD-10-CM

## 2019-06-16 ENCOUNTER — Ambulatory Visit
Admission: RE | Admit: 2019-06-16 | Discharge: 2019-06-16 | Disposition: A | Discharge status: Home / Self Care | Payer: Medicare PPO | Source: Ambulatory Visit | Attending: Internal Medicine | Admitting: Internal Medicine

## 2019-06-16 DIAGNOSIS — R1032 Left lower quadrant pain: Secondary | ICD-10-CM

## 2019-08-30 ENCOUNTER — Other Ambulatory Visit (HOSPITAL_COMMUNITY): Payer: Self-pay | Admitting: Nephrology

## 2019-08-30 ENCOUNTER — Other Ambulatory Visit: Payer: Self-pay | Admitting: Nephrology

## 2019-08-30 DIAGNOSIS — R768 Other specified abnormal immunological findings in serum: Secondary | ICD-10-CM

## 2019-08-30 DIAGNOSIS — D472 Monoclonal gammopathy: Secondary | ICD-10-CM

## 2019-09-01 ENCOUNTER — Ambulatory Visit
Admission: RE | Admit: 2019-09-01 | Discharge: 2019-09-01 | Disposition: A | Discharge status: Home / Self Care | Payer: Medicare PPO | Source: Ambulatory Visit | Attending: Internal Medicine | Admitting: Internal Medicine

## 2019-09-01 ENCOUNTER — Other Ambulatory Visit: Payer: Self-pay | Admitting: Internal Medicine

## 2019-09-01 DIAGNOSIS — M542 Cervicalgia: Secondary | ICD-10-CM

## 2019-09-06 ENCOUNTER — Ambulatory Visit (HOSPITAL_COMMUNITY)
Admission: RE | Admit: 2019-09-06 | Discharge: 2019-09-06 | Disposition: A | Discharge status: Home / Self Care | Payer: Medicare PPO | Source: Ambulatory Visit | Attending: Nephrology | Admitting: Nephrology

## 2019-09-06 ENCOUNTER — Other Ambulatory Visit: Payer: Self-pay

## 2019-09-06 DIAGNOSIS — R768 Other specified abnormal immunological findings in serum: Secondary | ICD-10-CM | POA: Insufficient documentation

## 2019-09-06 DIAGNOSIS — D472 Monoclonal gammopathy: Secondary | ICD-10-CM | POA: Diagnosis not present

## 2019-09-14 ENCOUNTER — Encounter (HOSPITAL_COMMUNITY): Payer: Self-pay

## 2019-09-15 ENCOUNTER — Inpatient Hospital Stay (HOSPITAL_COMMUNITY): Payer: Medicare PPO | Attending: Hematology | Admitting: Hematology

## 2019-09-15 ENCOUNTER — Other Ambulatory Visit: Payer: Self-pay

## 2019-09-15 ENCOUNTER — Inpatient Hospital Stay (HOSPITAL_COMMUNITY): Payer: Medicare PPO

## 2019-09-15 ENCOUNTER — Encounter (HOSPITAL_COMMUNITY): Payer: Self-pay | Admitting: Hematology

## 2019-09-15 DIAGNOSIS — E1122 Type 2 diabetes mellitus with diabetic chronic kidney disease: Secondary | ICD-10-CM | POA: Diagnosis not present

## 2019-09-15 DIAGNOSIS — D472 Monoclonal gammopathy: Secondary | ICD-10-CM | POA: Diagnosis present

## 2019-09-15 DIAGNOSIS — N183 Chronic kidney disease, stage 3 unspecified: Secondary | ICD-10-CM | POA: Diagnosis not present

## 2019-09-15 LAB — COMPREHENSIVE METABOLIC PANEL
ALT: 16 U/L (ref 0–44)
AST: 18 U/L (ref 15–41)
Albumin: 4 g/dL (ref 3.5–5.0)
Alkaline Phosphatase: 79 U/L (ref 38–126)
Anion gap: 8 (ref 5–15)
BUN: 33 mg/dL — ABNORMAL HIGH (ref 8–23)
CO2: 26 mmol/L (ref 22–32)
Calcium: 9.4 mg/dL (ref 8.9–10.3)
Chloride: 104 mmol/L (ref 98–111)
Creatinine, Ser: 1.79 mg/dL — ABNORMAL HIGH (ref 0.61–1.24)
GFR calc Af Amer: 40 mL/min — ABNORMAL LOW (ref >60)
GFR calc non Af Amer: 34 mL/min — ABNORMAL LOW (ref >60)
Glucose, Bld: 175 mg/dL — ABNORMAL HIGH (ref 70–99)
Potassium: 5.3 mmol/L — ABNORMAL HIGH (ref 3.5–5.1)
Sodium: 138 mmol/L (ref 135–145)
Total Bilirubin: 0.7 mg/dL (ref 0.3–1.2)
Total Protein: 7.7 g/dL (ref 6.5–8.1)

## 2019-09-15 LAB — CBC WITH DIFFERENTIAL/PLATELET
Abs Immature Granulocytes: 0.02 10*3/uL (ref 0.00–0.07)
Basophils Absolute: 0 10*3/uL (ref 0.0–0.1)
Basophils Relative: 1 %
Eosinophils Absolute: 0.1 10*3/uL (ref 0.0–0.5)
Eosinophils Relative: 1 %
HCT: 38.7 % — ABNORMAL LOW (ref 39.0–52.0)
Hemoglobin: 12.1 g/dL — ABNORMAL LOW (ref 13.0–17.0)
Immature Granulocytes: 0 %
Lymphocytes Relative: 27 %
Lymphs Abs: 1.4 10*3/uL (ref 0.7–4.0)
MCH: 28.5 pg (ref 26.0–34.0)
MCHC: 31.3 g/dL (ref 30.0–36.0)
MCV: 91.1 fL (ref 80.0–100.0)
Monocytes Absolute: 0.4 10*3/uL (ref 0.1–1.0)
Monocytes Relative: 8 %
Neutro Abs: 3.2 10*3/uL (ref 1.7–7.7)
Neutrophils Relative %: 63 %
Platelets: 170 10*3/uL (ref 150–400)
RBC: 4.25 MIL/uL (ref 4.22–5.81)
RDW: 14.6 % (ref 11.5–15.5)
WBC: 5 10*3/uL (ref 4.0–10.5)
nRBC: 0 % (ref 0.0–0.2)

## 2019-09-15 LAB — LACTATE DEHYDROGENASE: LDH: 155 U/L (ref 98–192)
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
## 2019-09-15 NOTE — Patient Instructions (Signed)
[place] at [facility] Discharge Instructions  You were seen today by Dr. Delton Coombes. He went over your recent results and possible diagnoses. You will be given a container to collect your urine over 24 hours and then you will return it to have it analyzed. Dr. Delton Coombes will see you back in 5 weeks for labs and follow up.   Thank you for choosing [place] at [facility] to provide your oncology and hematology care.  To afford each patient quality time with our provider, please arrive at least 15 minutes before your scheduled appointment time.   If you have a lab appointment with the [facility] please come in thru the  Main Entrance and check in at the main information desk  You need to re-schedule your appointment should you arrive 10 or more minutes late.  We strive to give you quality time with our providers, and arriving late affects you and other patients whose appointments are after yours.  Also, if you no show three or more times for appointments you may be dismissed from the clinic at the providers discretion.     Again, thank you for choosing [facility].  Our hope is that these requests will decrease the amount of time that you wait before being seen by our physicians.       _____________________________________________________________  Should you have questions after your visit to [facility], please contact our office at [phone] between the hours of 8:00 a.m. and 4:30 p.m.  Voicemails left after 4:00 p.m. will not be returned until the following business day.  For prescription refill requests, have your pharmacy contact our office and allow 72 hours.    Cancer Center Support Programs:   > Cancer Support Group  2nd Tuesday of the month 1pm-2pm, Journey Room

## 2019-09-18 LAB — PROTEIN ELECTROPHORESIS, SERUM
A/G Ratio: 1.1 (ref 0.7–1.7)
Albumin ELP: 3.9 g/dL (ref 2.9–4.4)
Alpha-1-Globulin: 0.1 g/dL (ref 0.0–0.4)
Alpha-2-Globulin: 0.9 g/dL (ref 0.4–1.0)
Beta Globulin: 0.9 g/dL (ref 0.7–1.3)
Gamma Globulin: 1.7 g/dL (ref 0.4–1.8)
Globulin, Total: 3.5 g/dL (ref 2.2–3.9)
M-Spike, %: 1.2 g/dL — ABNORMAL HIGH
Total Protein ELP: 7.4 g/dL (ref 6.0–8.5)

## 2019-09-18 LAB — IMMUNOFIXATION ELECTROPHORESIS
IgA: 56 mg/dL — ABNORMAL LOW (ref 61–437)
IgG (Immunoglobin G), Serum: 1811 mg/dL — ABNORMAL HIGH (ref 603–1613)
IgM (Immunoglobulin M), Srm: 15 mg/dL (ref 15–143)
Total Protein ELP: 7.2 g/dL (ref 6.0–8.5)

## 2019-09-18 LAB — KAPPA/LAMBDA LIGHT CHAINS
Kappa free light chain: 84.4 mg/L — ABNORMAL HIGH (ref 3.3–19.4)
Kappa, lambda light chain ratio: 5.99 — ABNORMAL HIGH (ref 0.26–1.65)
Lambda free light chains: 14.1 mg/L (ref 5.7–26.3)

## 2019-09-19 ENCOUNTER — Other Ambulatory Visit (HOSPITAL_COMMUNITY): Payer: Self-pay | Admitting: *Deleted

## 2019-09-19 ENCOUNTER — Other Ambulatory Visit (HOSPITAL_COMMUNITY)
Admission: RE | Admit: 2019-09-19 | Discharge: 2019-09-19 | Disposition: A | Discharge status: Home / Self Care | Payer: Medicare PPO | Source: Ambulatory Visit | Attending: Hematology | Admitting: Hematology

## 2019-09-19 DIAGNOSIS — D472 Monoclonal gammopathy: Secondary | ICD-10-CM | POA: Insufficient documentation

## 2019-09-20 LAB — BETA 2 MICROGLOBULIN, SERUM: Beta-2 Microglobulin: 2.5 mg/L — ABNORMAL HIGH (ref 0.6–2.4)

## 2019-09-21 LAB — UPEP/UIFE/LIGHT CHAINS/TP, 24-HR UR
% BETA, Urine: 24.9 %
ALPHA 1 URINE: 2 %
Albumin, U: 22.3 %
Alpha 2, Urine: 8.7 %
Free Kappa Lt Chains,Ur: 75.92 mg/L (ref 0.63–113.79)
Free Kappa/Lambda Ratio: 10.89 (ref 1.03–31.76)
Free Lambda Lt Chains,Ur: 6.97 mg/L (ref 0.47–11.77)
GAMMA GLOBULIN URINE: 42 %
Total Protein, Urine-Ur/day: 150 mg/24 hr (ref 30–150)
Total Protein, Urine: 9.2 mg/dL
Total Volume: 1625

## 2019-10-09 ENCOUNTER — Telehealth (HOSPITAL_COMMUNITY): Payer: Self-pay | Admitting: *Deleted

## 2019-10-11 ENCOUNTER — Encounter (HOSPITAL_COMMUNITY): Payer: Self-pay | Admitting: *Deleted

## 2019-10-11 NOTE — Progress Notes (Signed)
Patient called the clinic wanting the results of his 24 hour urine testing.  I advised patient that they did come back and showed some abnormal values. I explained to him that I can not give any recommendations as far as next steps, this will have to be done by our physician.  Patient is scheduled to follow up on 7/12 and I advised patient that during that visit the physician will explain the results further and provide next steps in his care.  He verbalizes appreciation for my time and understanding.

## 2019-10-23 ENCOUNTER — Inpatient Hospital Stay (HOSPITAL_COMMUNITY): Payer: Medicare PPO | Attending: Hematology | Admitting: Hematology

## 2019-10-23 ENCOUNTER — Other Ambulatory Visit: Payer: Self-pay

## 2019-10-23 ENCOUNTER — Encounter (HOSPITAL_COMMUNITY): Payer: Self-pay | Admitting: Hematology

## 2019-10-23 VITALS — BP 138/64 | HR 84 | Temp 99.1°F | Resp 18 | Wt 216.4 lb

## 2019-10-23 DIAGNOSIS — E875 Hyperkalemia: Secondary | ICD-10-CM | POA: Diagnosis not present

## 2019-10-23 DIAGNOSIS — E1122 Type 2 diabetes mellitus with diabetic chronic kidney disease: Secondary | ICD-10-CM | POA: Insufficient documentation

## 2019-10-23 DIAGNOSIS — N1832 Chronic kidney disease, stage 3b: Secondary | ICD-10-CM | POA: Insufficient documentation

## 2019-10-23 DIAGNOSIS — D472 Monoclonal gammopathy: Secondary | ICD-10-CM | POA: Diagnosis not present

## 2019-10-23 NOTE — Patient Instructions (Signed)
[place] at [facility] Discharge Instructions  You were seen today by Dr. Delton Coombes. He went over your recent results. Cut back on foods that are rich in potassium, such as bananas, oranges, apricot, cantaloupe, honeydew, grapefruit, spinach, broccoli, and potatoes. Dr. Delton Coombes will see you back in 3 months for labs and follow up.   Thank you for choosing [place] at [facility] to provide your oncology and hematology care.  To afford each patient quality time with our provider, please arrive at least 15 minutes before your scheduled appointment time.   If you have a lab appointment with the [facility] please come in thru the Main Entrance and check in at the main information desk  You need to re-schedule your appointment should you arrive 10 or more minutes late.  We strive to give you quality time with our providers, and arriving late affects you and other patients whose appointments are after yours.  Also, if you no show three or more times for appointments you may be dismissed from the clinic at the providers discretion.     Again, thank you for choosing [facility]  Our hope is that these requests will decrease the amount of time that you wait before being seen by our physicians.       _____________________________________________________________  Should you have questions after your visit to [facility], please contact our office at [phone] between the hours of 8:00 a.m. and 4:30 p.m.  Voicemails left after 4:00 p.m. will not be returned until the following business day.  For prescription refill requests, have your pharmacy contact our office and allow 72 hours.    Cancer Center Support Programs:   > Cancer Support Group  2nd Tuesday of the month 1pm-2pm, Journey Room
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
## 2019-10-26 NOTE — Addendum Note (Signed)
Addended by: Farley Ly on: 10/26/2019 04:54 PM   Modules accepted: Orders

## 2019-10-31 NOTE — Telephone Encounter (Signed)
Pt notified of results

## 2019-11-20 ENCOUNTER — Ambulatory Visit: Payer: Medicare Other | Admitting: Internal Medicine

## 2019-11-22 ENCOUNTER — Telehealth: Payer: Self-pay | Admitting: Internal Medicine

## 2019-11-22 DIAGNOSIS — G4733 Obstructive sleep apnea (adult) (pediatric): Secondary | ICD-10-CM

## 2019-11-22 NOTE — Telephone Encounter (Signed)
Please order DME replace old CPAP machine, current settings, mask of choice, humidifier, supplies, Airview/ card

## 2019-11-22 NOTE — Telephone Encounter (Signed)
Order for CPAP was sent  I tried calling the pt and there was no answer and VM never picked up

## 2019-11-22 NOTE — Telephone Encounter (Signed)
Spoke with the pt  He states needing a new CPAP b/c he lost his after making some modifications to his home  He states machine is several years old  Looks like the last time we ordered one for him was 03/05/15  He states that he is scheduled for ov with Dr Annamaria Boots in Oct 2021  Please advise thanks

## 2019-11-23 NOTE — Telephone Encounter (Signed)
Called and spoke to male. She states pt is not home right now but will have him call us back when he is available.

## 2019-11-24 ENCOUNTER — Other Ambulatory Visit: Payer: Self-pay | Admitting: Internal Medicine

## 2019-11-28 NOTE — Telephone Encounter (Signed)
Called and spoke to pt. Informed him of the recs per Dr. Halford Chessman. Pt states he was called to come pick up his new cpap. Pt aware to call back if any issues. Nothing further needed at this time.

## 2019-12-13 ENCOUNTER — Ambulatory Visit: Payer: Medicare PPO | Admitting: Internal Medicine

## 2019-12-13 ENCOUNTER — Other Ambulatory Visit: Payer: Self-pay

## 2019-12-13 ENCOUNTER — Encounter: Payer: Self-pay | Admitting: Internal Medicine

## 2019-12-13 VITALS — BP 140/76 | HR 53 | Temp 97.2°F | Ht 74.0 in | Wt 216.2 lb

## 2019-12-13 DIAGNOSIS — J4520 Mild intermittent asthma, uncomplicated: Secondary | ICD-10-CM

## 2019-12-13 DIAGNOSIS — G4733 Obstructive sleep apnea (adult) (pediatric): Secondary | ICD-10-CM

## 2019-12-13 MED ORDER — ALBUTEROL SULFATE HFA 108 (90 BASE) MCG/ACT IN AERS: 2.0000 | INHALATION_SPRAY | Freq: Four times a day (QID) | RESPIRATORY_TRACT | 12 refills | Status: DC | PRN

## 2019-12-13 MED ORDER — FLUTICASONE-SALMETEROL 100-50 MCG/DOSE IN AEPB: INHALATION_SPRAY | RESPIRATORY_TRACT | 12 refills | Status: DC

## 2019-12-13 NOTE — Patient Instructions (Signed)
Order- DME [facility]- please replace old CPAP machine, 5 cwp, mask of choice, humidifier, supplies, Airview// card  I printed scripts for your asthma inhalers  Please call if we can help

## 2019-12-13 NOTE — Progress Notes (Signed)
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  Occ sternal tight discomfort, unrelated to activity. Not during sleep. Inhaler helps. No radiation, palpitation or sweating. Lasts only a few seconds to an hour.  Continues Wixela.   12/13/19- 85 year old male former smoker followed for OSA, Allergic Rhinitis, Asthma, complicated by DM 2, CKD3, Glaucoma, Monoclonal Gammopathy,  CPAP 5/ [facility] He called recently to replace old CPAP machine. He had trouble using for a while last year when he was up a lot at night caring for wife after her CVA. He does state that CPAP helps and he intends to continue using.  Wixela 100, Ventolin hfa, Dymista, Body weight today 216 lbs Asthma control has been comfortable with no significant flares. Wixela and Ventolin help and refills are requested.   ROS-see HPI    + = positive Constitutional:   No-   weight loss, night sweats, fevers, chills, fatigue, lassitude. HEENT:   No-  headaches, difficulty swallowing, tooth/dental problems,  +sore throat,       No-  sneezing, itching, ear ache, nasal congestion, +post nasal drip,  CV:  No-   chest pain, orthopnea, PND, swelling in lower extremities, anasarca, dizziness, palpitations Resp: No-   shortness of breath with exertion or at rest.              No-   productive cough,  No non-productive cough,  No- coughing up of blood.               No-   change in color of mucus.  No- wheezing.   Skin: No-   rash or lesions. GI:  No-   heartburn, indigestion, abdominal pain, nausea, vomiting,  GU: MS:  No-   joint pain or swelling.   Neuro-     nothing unusual Psych:  No- change in mood or affect. No depression or anxiety.  No memory loss.  OBJ General- Alert, Oriented, Affect-,, Distress- none acute, looks well Skin- rash-none, lesions- none, excoriation- none Lymphadenopathy- none Head- atraumatic            Eyes- Gross vision intact, PERRLA, conjunctivae clear secretions            Ears- Hearing, canals-normal            Nose- Clear, no-Septal dev, mucus, polyps, erosion, perforation             Throat- Mallampati III-IV , mucosa clear , drainage- none, tonsils- atrophic, Neck- flexible , trachea midline, no stridor , thyroid nl, carotid no bruit Chest - symmetrical excursion , unlabored           Heart/CV- RRR , no murmur , no gallop  , no rub, nl s1 s2                           - JVD- none , edema- none, stasis changes- none, varices- none  Lung- clear to P&A, wheeze- none, cough- none , dullness-none, rub- none           Chest wall-  Abd-  Br/ Gen/ Rectal- Not done, not indicated Extrem- cyanosis- none, clubbing, none, atrophy- none, strength- nl Neuro- grossly intact to observation

## 2019-12-13 NOTE — Assessment & Plan Note (Signed)
Mild intermittent uncomplicated Plan- requests printed refills for Wixela and Ventolin hfa

## 2019-12-13 NOTE — Assessment & Plan Note (Signed)
Benefits from CPAP and he reports compliant use, now needing replacement for old machine. Plan- replace old CPAP 5 cwp

## 2020-01-17 ENCOUNTER — Ambulatory Visit: Payer: Medicare PPO | Admitting: Internal Medicine

## 2020-01-24 ENCOUNTER — Other Ambulatory Visit (HOSPITAL_COMMUNITY): Payer: Self-pay

## 2020-01-24 DIAGNOSIS — D472 Monoclonal gammopathy: Secondary | ICD-10-CM

## 2020-01-25 ENCOUNTER — Other Ambulatory Visit: Payer: Self-pay

## 2020-01-25 ENCOUNTER — Inpatient Hospital Stay (HOSPITAL_COMMUNITY): Payer: Medicare PPO | Attending: Hematology | Admitting: Oncology

## 2020-01-25 ENCOUNTER — Inpatient Hospital Stay (HOSPITAL_COMMUNITY): Payer: Medicare PPO

## 2020-01-25 VITALS — BP 123/81 | HR 66 | Temp 98.7°F | Wt 214.1 lb

## 2020-01-25 DIAGNOSIS — R5383 Other fatigue: Secondary | ICD-10-CM | POA: Insufficient documentation

## 2020-01-25 DIAGNOSIS — D472 Monoclonal gammopathy: Secondary | ICD-10-CM | POA: Diagnosis not present

## 2020-01-25 DIAGNOSIS — E1122 Type 2 diabetes mellitus with diabetic chronic kidney disease: Secondary | ICD-10-CM | POA: Insufficient documentation

## 2020-01-25 DIAGNOSIS — N1832 Chronic kidney disease, stage 3b: Secondary | ICD-10-CM | POA: Insufficient documentation

## 2020-01-25 LAB — COMPREHENSIVE METABOLIC PANEL
ALT: 14 U/L (ref 0–44)
AST: 18 U/L (ref 15–41)
Albumin: 3.8 g/dL (ref 3.5–5.0)
Alkaline Phosphatase: 73 U/L (ref 38–126)
Anion gap: 7 (ref 5–15)
BUN: 24 mg/dL — ABNORMAL HIGH (ref 8–23)
CO2: 26 mmol/L (ref 22–32)
Calcium: 9.4 mg/dL (ref 8.9–10.3)
Chloride: 105 mmol/L (ref 98–111)
Creatinine, Ser: 1.82 mg/dL — ABNORMAL HIGH (ref 0.61–1.24)
GFR, Estimated: 34 mL/min — ABNORMAL LOW (ref >60)
Glucose, Bld: 242 mg/dL — ABNORMAL HIGH (ref 70–99)
Potassium: 4.8 mmol/L (ref 3.5–5.1)
Sodium: 138 mmol/L (ref 135–145)
Total Bilirubin: 0.7 mg/dL (ref 0.3–1.2)
Total Protein: 7.5 g/dL (ref 6.5–8.1)

## 2020-01-25 LAB — CBC WITH DIFFERENTIAL/PLATELET
Abs Immature Granulocytes: 0.01 10*3/uL (ref 0.00–0.07)
Basophils Absolute: 0 10*3/uL (ref 0.0–0.1)
Basophils Relative: 0 %
Eosinophils Absolute: 0.1 10*3/uL (ref 0.0–0.5)
Eosinophils Relative: 2 %
HCT: 38.1 % — ABNORMAL LOW (ref 39.0–52.0)
Hemoglobin: 12.1 g/dL — ABNORMAL LOW (ref 13.0–17.0)
Immature Granulocytes: 0 %
Lymphocytes Relative: 28 %
Lymphs Abs: 1.4 10*3/uL (ref 0.7–4.0)
MCH: 28.7 pg (ref 26.0–34.0)
MCHC: 31.8 g/dL (ref 30.0–36.0)
MCV: 90.3 fL (ref 80.0–100.0)
Monocytes Absolute: 0.4 10*3/uL (ref 0.1–1.0)
Monocytes Relative: 8 %
Neutro Abs: 3.1 10*3/uL (ref 1.7–7.7)
Neutrophils Relative %: 62 %
Platelets: 162 10*3/uL (ref 150–400)
RBC: 4.22 MIL/uL (ref 4.22–5.81)
RDW: 13.7 % (ref 11.5–15.5)
WBC: 5.1 10*3/uL (ref 4.0–10.5)
nRBC: 0 % (ref 0.0–0.2)

## 2020-01-25 LAB — LACTATE DEHYDROGENASE: LDH: 142 U/L (ref 98–192)
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
## 2020-01-26 LAB — KAPPA/LAMBDA LIGHT CHAINS
Kappa free light chain: 89.7 mg/L — ABNORMAL HIGH (ref 3.3–19.4)
Kappa, lambda light chain ratio: 7.23 — ABNORMAL HIGH (ref 0.26–1.65)
Lambda free light chains: 12.4 mg/L (ref 5.7–26.3)

## 2020-01-29 LAB — PROTEIN ELECTROPHORESIS, SERUM
A/G Ratio: 1.1 (ref 0.7–1.7)
Albumin ELP: 3.8 g/dL (ref 2.9–4.4)
Alpha-1-Globulin: 0.2 g/dL (ref 0.0–0.4)
Alpha-2-Globulin: 0.9 g/dL (ref 0.4–1.0)
Beta Globulin: 0.8 g/dL (ref 0.7–1.3)
Gamma Globulin: 1.6 g/dL (ref 0.4–1.8)
Globulin, Total: 3.5 g/dL (ref 2.2–3.9)
M-Spike, %: 1.1 g/dL — ABNORMAL HIGH
Total Protein ELP: 7.3 g/dL (ref 6.0–8.5)

## 2020-02-21 ENCOUNTER — Telehealth: Payer: Self-pay | Admitting: Internal Medicine

## 2020-02-22 NOTE — Telephone Encounter (Signed)
I have messaged [facility] pt was to be set up today told them they may want to call him

## 2020-02-22 NOTE — Telephone Encounter (Signed)
Attempted to call patient phone just rings will try again tomorrow

## 2020-02-22 NOTE — Telephone Encounter (Signed)
They have Luna 3 B cpap machines that they can set pt up with they are just waiting on a shipment this is per [facility]

## 2020-02-22 NOTE — Telephone Encounter (Signed)
Also tried to call pt but line just rang and rang. Unable to leave VM.

## 2020-05-13 ENCOUNTER — Other Ambulatory Visit: Payer: Self-pay | Admitting: Internal Medicine

## 2020-05-13 ENCOUNTER — Ambulatory Visit
Admission: RE | Admit: 2020-05-13 | Discharge: 2020-05-13 | Disposition: A | Discharge status: Home / Self Care | Payer: Medicare PPO | Source: Ambulatory Visit | Attending: Internal Medicine | Admitting: Internal Medicine

## 2020-05-13 DIAGNOSIS — R1084 Generalized abdominal pain: Secondary | ICD-10-CM

## 2020-05-28 ENCOUNTER — Other Ambulatory Visit (HOSPITAL_COMMUNITY): Payer: Self-pay | Admitting: *Deleted

## 2020-05-28 DIAGNOSIS — D472 Monoclonal gammopathy: Secondary | ICD-10-CM

## 2020-05-29 ENCOUNTER — Inpatient Hospital Stay (HOSPITAL_COMMUNITY): Payer: Medicare PPO | Attending: Hematology

## 2020-05-29 ENCOUNTER — Other Ambulatory Visit: Payer: Self-pay

## 2020-05-29 DIAGNOSIS — D472 Monoclonal gammopathy: Secondary | ICD-10-CM | POA: Diagnosis present

## 2020-05-29 DIAGNOSIS — N1832 Chronic kidney disease, stage 3b: Secondary | ICD-10-CM | POA: Insufficient documentation

## 2020-05-29 DIAGNOSIS — Z87891 Personal history of nicotine dependence: Secondary | ICD-10-CM | POA: Diagnosis not present

## 2020-05-29 DIAGNOSIS — E1122 Type 2 diabetes mellitus with diabetic chronic kidney disease: Secondary | ICD-10-CM | POA: Diagnosis not present

## 2020-05-29 DIAGNOSIS — Z7951 Long term (current) use of inhaled steroids: Secondary | ICD-10-CM | POA: Insufficient documentation

## 2020-05-29 DIAGNOSIS — Z7984 Long term (current) use of oral hypoglycemic drugs: Secondary | ICD-10-CM | POA: Insufficient documentation

## 2020-05-29 DIAGNOSIS — Z7982 Long term (current) use of aspirin: Secondary | ICD-10-CM | POA: Insufficient documentation

## 2020-05-29 DIAGNOSIS — Z79899 Other long term (current) drug therapy: Secondary | ICD-10-CM | POA: Diagnosis not present

## 2020-05-29 DIAGNOSIS — I129 Hypertensive chronic kidney disease with stage 1 through stage 4 chronic kidney disease, or unspecified chronic kidney disease: Secondary | ICD-10-CM | POA: Insufficient documentation

## 2020-05-29 LAB — CBC WITH DIFFERENTIAL/PLATELET
Abs Immature Granulocytes: 0.01 10*3/uL (ref 0.00–0.07)
Basophils Absolute: 0 10*3/uL (ref 0.0–0.1)
Basophils Relative: 1 %
Eosinophils Absolute: 0.1 10*3/uL (ref 0.0–0.5)
Eosinophils Relative: 2 %
HCT: 37.9 % — ABNORMAL LOW (ref 39.0–52.0)
Hemoglobin: 12 g/dL — ABNORMAL LOW (ref 13.0–17.0)
Immature Granulocytes: 0 %
Lymphocytes Relative: 29 %
Lymphs Abs: 1.6 10*3/uL (ref 0.7–4.0)
MCH: 28.9 pg (ref 26.0–34.0)
MCHC: 31.7 g/dL (ref 30.0–36.0)
MCV: 91.3 fL (ref 80.0–100.0)
Monocytes Absolute: 0.4 10*3/uL (ref 0.1–1.0)
Monocytes Relative: 8 %
Neutro Abs: 3.4 10*3/uL (ref 1.7–7.7)
Neutrophils Relative %: 60 %
Platelets: 141 10*3/uL — ABNORMAL LOW (ref 150–400)
RBC: 4.15 MIL/uL — ABNORMAL LOW (ref 4.22–5.81)
RDW: 14.3 % (ref 11.5–15.5)
WBC: 5.5 10*3/uL (ref 4.0–10.5)
nRBC: 0 % (ref 0.0–0.2)

## 2020-05-29 LAB — LACTATE DEHYDROGENASE: LDH: 151 U/L (ref 98–192)

## 2020-05-29 LAB — COMPREHENSIVE METABOLIC PANEL
ALT: 17 U/L (ref 0–44)
AST: 18 U/L (ref 15–41)
Albumin: 3.7 g/dL (ref 3.5–5.0)
Alkaline Phosphatase: 78 U/L (ref 38–126)
Anion gap: 7 (ref 5–15)
BUN: 26 mg/dL — ABNORMAL HIGH (ref 8–23)
CO2: 25 mmol/L (ref 22–32)
Calcium: 9.6 mg/dL (ref 8.9–10.3)
Chloride: 106 mmol/L (ref 98–111)
Creatinine, Ser: 1.94 mg/dL — ABNORMAL HIGH (ref 0.61–1.24)
GFR, Estimated: 34 mL/min — ABNORMAL LOW (ref >60)
Glucose, Bld: 262 mg/dL — ABNORMAL HIGH (ref 70–99)
Potassium: 4.6 mmol/L (ref 3.5–5.1)
Sodium: 138 mmol/L (ref 135–145)
Total Bilirubin: 0.6 mg/dL (ref 0.3–1.2)
Total Protein: 7.4 g/dL (ref 6.5–8.1)

## 2020-05-30 LAB — KAPPA/LAMBDA LIGHT CHAINS
Kappa free light chain: 74.5 mg/L — ABNORMAL HIGH (ref 3.3–19.4)
Kappa, lambda light chain ratio: 5.96 — ABNORMAL HIGH (ref 0.26–1.65)
Lambda free light chains: 12.5 mg/L (ref 5.7–26.3)

## 2020-05-31 LAB — PROTEIN ELECTROPHORESIS, SERUM
A/G Ratio: 1.4 (ref 0.7–1.7)
Albumin ELP: 4 g/dL (ref 2.9–4.4)
Alpha-1-Globulin: 0.1 g/dL (ref 0.0–0.4)
Alpha-2-Globulin: 0.6 g/dL (ref 0.4–1.0)
Beta Globulin: 0.7 g/dL (ref 0.7–1.3)
Gamma Globulin: 1.5 g/dL (ref 0.4–1.8)
Globulin, Total: 2.9 g/dL (ref 2.2–3.9)
M-Spike, %: 1 g/dL — ABNORMAL HIGH
Total Protein ELP: 6.9 g/dL (ref 6.0–8.5)

## 2020-06-05 ENCOUNTER — Other Ambulatory Visit: Payer: Self-pay

## 2020-06-05 ENCOUNTER — Inpatient Hospital Stay (HOSPITAL_COMMUNITY): Payer: Medicare PPO | Admitting: Hematology

## 2020-06-05 VITALS — BP 146/70 | HR 70 | Temp 97.3°F | Resp 18 | Wt 222.1 lb

## 2020-06-05 DIAGNOSIS — D472 Monoclonal gammopathy: Secondary | ICD-10-CM | POA: Diagnosis not present
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
## 2020-06-05 NOTE — Patient Instructions (Signed)
[place] at [facility] Discharge Instructions  You were seen today by Dr. Delton Coombes. He went over your recent results. You will be scheduled to have scans of your bones done before your next visit. Drink 1-2 liters of water daily and AVOID pain medications such as ibuprofen and naproxen. Dr. Delton Coombes will see you back in 4 months for labs and follow up.   Thank you for choosing [place] at [facility] to provide your oncology and hematology care.  To afford each patient quality time with our provider, please arrive at least 15 minutes before your scheduled appointment time.   If you have a lab appointment with the [facility] please come in thru the Main Entrance and check in at the main information desk  You need to re-schedule your appointment should you arrive 10 or more minutes late.  We strive to give you quality time with our providers, and arriving late affects you and other patients whose appointments are after yours.  Also, if you no show three or more times for appointments you may be dismissed from the clinic at the providers discretion.     Again, thank you for choosing [facility].  Our hope is that these requests will decrease the amount of time that you wait before being seen by our physicians.       _____________________________________________________________  Should you have questions after your visit to [facility], please contact our office at [phone] between the hours of 8:00 a.m. and 4:30 p.m.  Voicemails left after 4:00 p.m. will not be returned until the following business day.  For prescription refill requests, have your pharmacy contact our office and allow 72 hours.    Cancer Center Support Programs:   > Cancer Support Group  2nd Tuesday of the month 1pm-2pm, Journey Room

## 2020-09-05 ENCOUNTER — Ambulatory Visit (HOSPITAL_COMMUNITY)
Admission: RE | Admit: 2020-09-05 | Discharge: 2020-09-05 | Disposition: A | Discharge status: Home / Self Care | Payer: Medicare PPO | Source: Ambulatory Visit | Attending: Internal Medicine | Admitting: Internal Medicine

## 2020-09-05 ENCOUNTER — Other Ambulatory Visit (HOSPITAL_COMMUNITY): Payer: Self-pay | Admitting: Internal Medicine

## 2020-09-05 ENCOUNTER — Other Ambulatory Visit: Payer: Self-pay

## 2020-09-05 ENCOUNTER — Ambulatory Visit (INDEPENDENT_AMBULATORY_CARE_PROVIDER_SITE_OTHER)
Admission: RE | Admit: 2020-09-05 | Discharge: 2020-09-05 | Disposition: A | Discharge status: Home / Self Care | Payer: Medicare PPO | Source: Ambulatory Visit | Attending: Internal Medicine | Admitting: Internal Medicine

## 2020-09-05 DIAGNOSIS — I739 Peripheral vascular disease, unspecified: Secondary | ICD-10-CM

## 2020-10-02 ENCOUNTER — Inpatient Hospital Stay (HOSPITAL_COMMUNITY): Payer: Medicare PPO | Attending: Hematology

## 2020-10-02 ENCOUNTER — Other Ambulatory Visit: Payer: Self-pay

## 2020-10-02 ENCOUNTER — Ambulatory Visit (HOSPITAL_COMMUNITY)
Admission: RE | Admit: 2020-10-02 | Discharge: 2020-10-02 | Disposition: A | Discharge status: Home / Self Care | Payer: Medicare PPO | Source: Ambulatory Visit | Attending: Hematology | Admitting: Hematology

## 2020-10-02 DIAGNOSIS — D472 Monoclonal gammopathy: Secondary | ICD-10-CM | POA: Diagnosis present

## 2020-10-02 DIAGNOSIS — N1832 Chronic kidney disease, stage 3b: Secondary | ICD-10-CM | POA: Diagnosis not present

## 2020-10-02 DIAGNOSIS — D649 Anemia, unspecified: Secondary | ICD-10-CM | POA: Insufficient documentation

## 2020-10-02 LAB — COMPREHENSIVE METABOLIC PANEL
ALT: 16 U/L (ref 0–44)
AST: 19 U/L (ref 15–41)
Albumin: 3.9 g/dL (ref 3.5–5.0)
Alkaline Phosphatase: 88 U/L (ref 38–126)
Anion gap: 7 (ref 5–15)
BUN: 33 mg/dL — ABNORMAL HIGH (ref 8–23)
CO2: 23 mmol/L (ref 22–32)
Calcium: 9.1 mg/dL (ref 8.9–10.3)
Chloride: 104 mmol/L (ref 98–111)
Creatinine, Ser: 2.18 mg/dL — ABNORMAL HIGH (ref 0.61–1.24)
GFR, Estimated: 29 mL/min — ABNORMAL LOW (ref >60)
Glucose, Bld: 380 mg/dL — ABNORMAL HIGH (ref 70–99)
Potassium: 4.6 mmol/L (ref 3.5–5.1)
Sodium: 134 mmol/L — ABNORMAL LOW (ref 135–145)
Total Bilirubin: 0.5 mg/dL (ref 0.3–1.2)
Total Protein: 7.5 g/dL (ref 6.5–8.1)

## 2020-10-02 LAB — IRON AND TIBC
Iron: 73 ug/dL (ref 45–182)
Saturation Ratios: 22 % (ref 17.9–39.5)
TIBC: 339 ug/dL (ref 250–450)
UIBC: 266 ug/dL

## 2020-10-02 LAB — CBC WITH DIFFERENTIAL/PLATELET
Abs Immature Granulocytes: 0.01 10*3/uL (ref 0.00–0.07)
Basophils Absolute: 0 10*3/uL (ref 0.0–0.1)
Basophils Relative: 1 %
Eosinophils Absolute: 0.1 10*3/uL (ref 0.0–0.5)
Eosinophils Relative: 1 %
HCT: 38.8 % — ABNORMAL LOW (ref 39.0–52.0)
Hemoglobin: 12.2 g/dL — ABNORMAL LOW (ref 13.0–17.0)
Immature Granulocytes: 0 %
Lymphocytes Relative: 31 %
Lymphs Abs: 1.7 10*3/uL (ref 0.7–4.0)
MCH: 28.8 pg (ref 26.0–34.0)
MCHC: 31.4 g/dL (ref 30.0–36.0)
MCV: 91.5 fL (ref 80.0–100.0)
Monocytes Absolute: 0.4 10*3/uL (ref 0.1–1.0)
Monocytes Relative: 7 %
Neutro Abs: 3.3 10*3/uL (ref 1.7–7.7)
Neutrophils Relative %: 60 %
Platelets: 160 10*3/uL (ref 150–400)
RBC: 4.24 MIL/uL (ref 4.22–5.81)
RDW: 13.8 % (ref 11.5–15.5)
WBC: 5.5 10*3/uL (ref 4.0–10.5)
nRBC: 0 % (ref 0.0–0.2)

## 2020-10-02 LAB — FERRITIN: Ferritin: 24 ng/mL (ref 24–336)

## 2020-10-03 LAB — KAPPA/LAMBDA LIGHT CHAINS
Kappa free light chain: 81.7 mg/L — ABNORMAL HIGH (ref 3.3–19.4)
Kappa, lambda light chain ratio: 5.84 — ABNORMAL HIGH (ref 0.26–1.65)
Lambda free light chains: 14 mg/L (ref 5.7–26.3)

## 2020-10-03 LAB — PROTEIN ELECTROPHORESIS, SERUM
A/G Ratio: 1.1 (ref 0.7–1.7)
Albumin ELP: 3.8 g/dL (ref 2.9–4.4)
Alpha-1-Globulin: 0.2 g/dL (ref 0.0–0.4)
Alpha-2-Globulin: 0.9 g/dL (ref 0.4–1.0)
Beta Globulin: 0.9 g/dL (ref 0.7–1.3)
Gamma Globulin: 1.6 g/dL (ref 0.4–1.8)
Globulin, Total: 3.5 g/dL (ref 2.2–3.9)
M-Spike, %: 1.1 g/dL — ABNORMAL HIGH
Total Protein ELP: 7.3 g/dL (ref 6.0–8.5)
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
## 2020-10-09 ENCOUNTER — Other Ambulatory Visit: Payer: Self-pay

## 2020-10-09 ENCOUNTER — Encounter (HOSPITAL_COMMUNITY): Payer: Self-pay | Admitting: Physician Assistant

## 2020-10-09 ENCOUNTER — Ambulatory Visit (HOSPITAL_COMMUNITY): Payer: Medicare PPO | Admitting: Hematology and Oncology

## 2020-10-09 ENCOUNTER — Inpatient Hospital Stay (HOSPITAL_COMMUNITY): Payer: Medicare PPO | Admitting: Physician Assistant

## 2020-10-09 VITALS — BP 145/71 | HR 64 | Temp 97.8°F | Resp 18 | Wt 218.0 lb

## 2020-10-09 DIAGNOSIS — D509 Iron deficiency anemia, unspecified: Secondary | ICD-10-CM

## 2020-10-09 DIAGNOSIS — D472 Monoclonal gammopathy: Secondary | ICD-10-CM

## 2020-10-09 NOTE — Patient Instructions (Signed)
[place] at [facility] Discharge Instructions  You were seen today by Tarri Abernethy PA-C for your MGUS (abnormal protein).  Your protein levels are stable, and did not show any signs of progression from MGUS to multiple myeloma.  HOWEVER, your kidney function is slightly worse.  This could be related to dehydration.  It is important that you limit yourself to only 1 soda per day.  You should drink at least 64 ounces of water each day.  Avoid medications called NSAIDs, such as ibuprofen, Aleve, etc.  Your iron is slightly low, so I would like you to start taking an iron pill (ferrous sulfate 325 mg) once per day.  This can be purchased over-the-counter at the drugstore of your choice.  Iron pills can sometimes cause constipation, so you may need to start taking a stool softener such as Colace.  LABS: Return in 3 months for repeat labs  MEDICATIONS: Start taking ferrous sulfate (iron pill) once daily.  FOLLOW-UP APPOINTMENT: - Follow-up visit in 3 months - Follow-up with your kidney doctor to make sure that your kidneys are not getting worse.   Thank you for choosing [place] at [facility] to provide your oncology and hematology care.  To afford each patient quality time with our provider, please arrive at least 15 minutes before your scheduled appointment time.   If you have a lab appointment with the [facility] please come in thru the Main Entrance and check in at the main information desk.  You need to re-schedule your appointment should you arrive 10 or more minutes late.  We strive to give you quality time with our providers, and arriving late affects you and other patients whose appointments are after yours.  Also, if you no show three or more times for appointments you may be dismissed from the clinic at the providers discretion.     Again, thank you for choosing [facility].  Our hope is that these requests will  decrease the amount of time that you wait before being seen by our physicians.       _____________________________________________________________  Should you have questions after your visit to [facility], please contact our office at [phone] and follow the prompts.  Our office hours are 8:00 a.m. and 4:30 p.m. Monday - Friday.  Please note that voicemails left after 4:00 p.m. may not be returned until the following business day.  We are closed weekends and major holidays.  You do have access to a nurse 24-7, just call the main number to the clinic [phone] and do not press any options, hold on the line and a nurse will answer the phone.    For prescription refill requests, have your pharmacy contact our office and allow 72 hours.    Due to Covid, you will need to wear a mask upon entering the hospital. If you do not have a mask, a mask will be given to you at the Main Entrance upon arrival. For doctor visits, patients may have 1 support person age 69 or older with them. For treatment visits, patients can not have anyone with them due to social distancing guidelines and our immunocompromised population.

## 2020-11-12 ENCOUNTER — Ambulatory Visit: Payer: Medicare PPO

## 2020-11-13 ENCOUNTER — Other Ambulatory Visit (HOSPITAL_BASED_OUTPATIENT_CLINIC_OR_DEPARTMENT_OTHER): Payer: Self-pay

## 2020-11-13 ENCOUNTER — Ambulatory Visit: Payer: Medicare PPO | Attending: Internal Medicine

## 2020-11-13 DIAGNOSIS — Z23 Encounter for immunization: Secondary | ICD-10-CM

## 2020-11-13 MED ORDER — PFIZER-BIONT COVID-19 VAC-TRIS 30 MCG/0.3ML IM SUSP
INTRAMUSCULAR | 0 refills | Status: DC
  Filled 2020-11-13: qty 0.3, 1d supply, fill #0

## 2020-11-13 NOTE — Progress Notes (Signed)
   Covid-19 Vaccination Clinic  Name:  [name]    MRN: [id number] DOB: [birth year]  11/13/2020  [name] was observed post Covid-19 immunization for 15 minutes without incident. He was provided with Vaccine Information Sheet and instruction to access the V-Safe system.   [name] was instructed to call 911 with any severe reactions post vaccine: Difficulty breathing  Swelling of face and throat  A fast heartbeat  A bad rash all over body  Dizziness and weakness   Immunizations Administered     Name Date Dose VIS Date Route   PFIZER Comrnaty(Gray TOP) Covid-19 Vaccine 11/13/2020 11:18 AM 0.3 mL 03/21/2020 Intramuscular   Manufacturer: [place]   Lot: [id number]   [place]: [id number]

## 2020-11-15 ENCOUNTER — Ambulatory Visit: Payer: Medicare PPO

## 2020-12-13 ENCOUNTER — Ambulatory Visit: Payer: Medicare PPO | Admitting: Internal Medicine

## 2021-01-15 ENCOUNTER — Inpatient Hospital Stay (HOSPITAL_COMMUNITY): Payer: Medicare PPO | Attending: Hematology

## 2021-01-15 ENCOUNTER — Other Ambulatory Visit: Payer: Self-pay

## 2021-01-15 ENCOUNTER — Inpatient Hospital Stay (HOSPITAL_COMMUNITY): Payer: Medicare PPO

## 2021-01-15 DIAGNOSIS — D509 Iron deficiency anemia, unspecified: Secondary | ICD-10-CM

## 2021-01-15 DIAGNOSIS — N1832 Chronic kidney disease, stage 3b: Secondary | ICD-10-CM | POA: Diagnosis not present

## 2021-01-15 DIAGNOSIS — E1122 Type 2 diabetes mellitus with diabetic chronic kidney disease: Secondary | ICD-10-CM | POA: Diagnosis not present

## 2021-01-15 DIAGNOSIS — D472 Monoclonal gammopathy: Secondary | ICD-10-CM

## 2021-01-15 LAB — COMPREHENSIVE METABOLIC PANEL
ALT: 16 U/L (ref 0–44)
AST: 16 U/L (ref 15–41)
Albumin: 3.9 g/dL (ref 3.5–5.0)
Alkaline Phosphatase: 76 U/L (ref 38–126)
Anion gap: 6 (ref 5–15)
BUN: 27 mg/dL — ABNORMAL HIGH (ref 8–23)
CO2: 26 mmol/L (ref 22–32)
Calcium: 8.9 mg/dL (ref 8.9–10.3)
Chloride: 106 mmol/L (ref 98–111)
Creatinine, Ser: 2.06 mg/dL — ABNORMAL HIGH (ref 0.61–1.24)
GFR, Estimated: 31 mL/min — ABNORMAL LOW (ref >60)
Glucose, Bld: 190 mg/dL — ABNORMAL HIGH (ref 70–99)
Potassium: 4.4 mmol/L (ref 3.5–5.1)
Sodium: 138 mmol/L (ref 135–145)
Total Bilirubin: 0.9 mg/dL (ref 0.3–1.2)
Total Protein: 7.6 g/dL (ref 6.5–8.1)

## 2021-01-15 LAB — CBC WITH DIFFERENTIAL/PLATELET
Abs Immature Granulocytes: 0.01 10*3/uL (ref 0.00–0.07)
Basophils Absolute: 0 10*3/uL (ref 0.0–0.1)
Basophils Relative: 0 %
Eosinophils Absolute: 0.1 10*3/uL (ref 0.0–0.5)
Eosinophils Relative: 1 %
HCT: 38 % — ABNORMAL LOW (ref 39.0–52.0)
Hemoglobin: 12.1 g/dL — ABNORMAL LOW (ref 13.0–17.0)
Immature Granulocytes: 0 %
Lymphocytes Relative: 22 %
Lymphs Abs: 1.2 10*3/uL (ref 0.7–4.0)
MCH: 30 pg (ref 26.0–34.0)
MCHC: 31.8 g/dL (ref 30.0–36.0)
MCV: 94.3 fL (ref 80.0–100.0)
Monocytes Absolute: 0.4 10*3/uL (ref 0.1–1.0)
Monocytes Relative: 7 %
Neutro Abs: 3.8 10*3/uL (ref 1.7–7.7)
Neutrophils Relative %: 70 %
Platelets: 168 10*3/uL (ref 150–400)
RBC: 4.03 MIL/uL — ABNORMAL LOW (ref 4.22–5.81)
RDW: 14.6 % (ref 11.5–15.5)
WBC: 5.4 10*3/uL (ref 4.0–10.5)
nRBC: 0 % (ref 0.0–0.2)

## 2021-01-15 LAB — FERRITIN: Ferritin: 22 ng/mL — ABNORMAL LOW (ref 24–336)

## 2021-01-15 LAB — LACTATE DEHYDROGENASE: LDH: 146 U/L (ref 98–192)

## 2021-01-15 LAB — IRON AND TIBC
Iron: 101 ug/dL (ref 45–182)
Saturation Ratios: 32 % (ref 17.9–39.5)
TIBC: 319 ug/dL (ref 250–450)
UIBC: 218 ug/dL

## 2021-01-16 LAB — KAPPA/LAMBDA LIGHT CHAINS
Kappa free light chain: 98.1 mg/L — ABNORMAL HIGH (ref 3.3–19.4)
Kappa, lambda light chain ratio: 8.24 — ABNORMAL HIGH (ref 0.26–1.65)
Lambda free light chains: 11.9 mg/L (ref 5.7–26.3)

## 2021-01-17 LAB — PROTEIN ELECTROPHORESIS, SERUM
A/G Ratio: 1.2 (ref 0.7–1.7)
Albumin ELP: 3.8 g/dL (ref 2.9–4.4)
Alpha-1-Globulin: 0.2 g/dL (ref 0.0–0.4)
Alpha-2-Globulin: 0.8 g/dL (ref 0.4–1.0)
Beta Globulin: 0.8 g/dL (ref 0.7–1.3)
Gamma Globulin: 1.6 g/dL (ref 0.4–1.8)
Globulin, Total: 3.3 g/dL (ref 2.2–3.9)
M-Spike, %: 1.1 g/dL — ABNORMAL HIGH
Total Protein ELP: 7.1 g/dL (ref 6.0–8.5)
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
## 2021-01-22 ENCOUNTER — Encounter (HOSPITAL_COMMUNITY): Payer: Self-pay | Admitting: Physician Assistant

## 2021-01-22 ENCOUNTER — Ambulatory Visit (HOSPITAL_COMMUNITY): Payer: Medicare PPO | Admitting: Physician Assistant

## 2021-01-22 ENCOUNTER — Inpatient Hospital Stay (HOSPITAL_BASED_OUTPATIENT_CLINIC_OR_DEPARTMENT_OTHER): Payer: Medicare PPO | Admitting: Physician Assistant

## 2021-01-22 VITALS — BP 118/84 | HR 71 | Temp 96.7°F | Resp 18 | Wt 215.6 lb

## 2021-01-22 DIAGNOSIS — D472 Monoclonal gammopathy: Secondary | ICD-10-CM | POA: Diagnosis not present

## 2021-01-22 DIAGNOSIS — D509 Iron deficiency anemia, unspecified: Secondary | ICD-10-CM | POA: Diagnosis not present

## 2021-01-22 HISTORY — DX: Iron deficiency anemia, unspecified: D50.9

## 2021-01-22 NOTE — Patient Instructions (Signed)
[name] at [facility] Discharge Instructions  You were seen today by Tarri Abernethy PA-C for your iron deficiency anemia and your abnormal protein (MGUS).  Since the iron pill did not improve your iron levels, you can stop taking the iron pill.  Instead, we will give you IV iron infusions x3 doses.  Your protein levels remain stable.  We will check them again at your follow-up visit.  LABS: Return in 4 months for repeat labs  MEDICATIONS: - STOP taking iron pill at home - IV iron (Venofer) x3 doses  FOLLOW-UP APPOINTMENT: Office visit in 4 months   Thank you for choosing [place] at [facility] to provide your oncology and hematology care.  To afford each patient quality time with our provider, please arrive at least 15 minutes before your scheduled appointment time.   If you have a lab appointment with the [facility] please come in thru the Main Entrance and check in at the main information desk.  You need to re-schedule your appointment should you arrive 10 or more minutes late.  We strive to give you quality time with our providers, and arriving late affects you and other patients whose appointments are after yours.  Also, if you no show three or more times for appointments you may be dismissed from the clinic at the providers discretion.     Again, thank you for choosing [facility].  Our hope is that these requests will decrease the amount of time that you wait before being seen by our physicians.       _____________________________________________________________  Should you have questions after your visit to [facility], please contact our office at [phone] and follow the prompts.  Our office hours are 8:00 a.m. and 4:30 p.m. Monday - Friday.  Please note that voicemails left after 4:00 p.m. may not be returned until the following business day.  We are closed weekends and major holidays.  You  do have access to a nurse 24-7, just call the main number to the clinic [phone] and do not press any options, hold on the line and a nurse will answer the phone.    For prescription refill requests, have your pharmacy contact our office and allow 72 hours.    Due to Covid, you will need to wear a mask upon entering the hospital. If you do not have a mask, a mask will be given to you at the Main Entrance upon arrival. For doctor visits, patients may have 1 support person age 84 or older with them. For treatment visits, patients can not have anyone with them due to social distancing guidelines and our immunocompromised population.

## 2021-01-23 ENCOUNTER — Telehealth (HOSPITAL_COMMUNITY): Payer: Self-pay | Admitting: Physician Assistant

## 2021-01-31 ENCOUNTER — Inpatient Hospital Stay (HOSPITAL_COMMUNITY): Payer: Medicare PPO

## 2021-01-31 ENCOUNTER — Other Ambulatory Visit: Payer: Self-pay

## 2021-01-31 VITALS — BP 143/70 | HR 66 | Temp 96.9°F | Resp 16

## 2021-01-31 DIAGNOSIS — D509 Iron deficiency anemia, unspecified: Secondary | ICD-10-CM

## 2021-01-31 DIAGNOSIS — D472 Monoclonal gammopathy: Secondary | ICD-10-CM | POA: Diagnosis not present

## 2021-01-31 MED ORDER — SODIUM CHLORIDE 0.9 % IV SOLN
300.0000 mg | Freq: Once | INTRAVENOUS | Status: AC
  Administered 2021-01-31: 300 mg via INTRAVENOUS
  Filled 2021-01-31: qty 300

## 2021-01-31 MED ORDER — LORATADINE 10 MG PO TABS
10.0000 mg | ORAL_TABLET | Freq: Once | ORAL | Status: AC
  Administered 2021-01-31: 10 mg via ORAL
  Filled 2021-01-31: qty 1

## 2021-01-31 MED ORDER — ACETAMINOPHEN 325 MG PO TABS
650.0000 mg | ORAL_TABLET | Freq: Once | ORAL | Status: AC
  Administered 2021-01-31: 650 mg via ORAL
  Filled 2021-01-31: qty 2

## 2021-01-31 MED ORDER — SODIUM CHLORIDE 0.9 % IV SOLN: Freq: Once | INTRAVENOUS | Status: AC

## 2021-01-31 NOTE — Patient Instructions (Signed)
[facility]  Discharge Instructions: Thank you for choosing [facility] to provide your oncology and hematology care.  If you have a lab appointment with the Cancer Center, please come in thru the Main Entrance and check in at the main information desk.  Wear comfortable clothing and clothing appropriate for easy access to any Portacath or PICC line.   We strive to give you quality time with your provider. You may need to reschedule your appointment if you arrive late (15 or more minutes).  Arriving late affects you and other patients whose appointments are after yours.  Also, if you miss three or more appointments without notifying the office, you may be dismissed from the clinic at the provider's discretion.      For prescription refill requests, have your pharmacy contact our office and allow 72 hours for refills to be completed.    Today you received the following chemotherapy and/or immunotherapy agents Venofer      To help prevent nausea and vomiting after your treatment, we encourage you to take your nausea medication as directed.  BELOW ARE SYMPTOMS THAT SHOULD BE REPORTED IMMEDIATELY: *FEVER GREATER THAN 100.4 F (38 C) OR HIGHER *CHILLS OR SWEATING *NAUSEA AND VOMITING THAT IS NOT CONTROLLED WITH YOUR NAUSEA MEDICATION *UNUSUAL SHORTNESS OF BREATH *UNUSUAL BRUISING OR BLEEDING *URINARY PROBLEMS (pain or burning when urinating, or frequent urination) *BOWEL PROBLEMS (unusual diarrhea, constipation, pain near the anus) TENDERNESS IN MOUTH AND THROAT WITH OR WITHOUT PRESENCE OF ULCERS (sore throat, sores in mouth, or a toothache) UNUSUAL RASH, SWELLING OR PAIN  UNUSUAL VAGINAL DISCHARGE OR ITCHING   Items with * indicate a potential emergency and should be followed up as soon as possible or go to the Emergency Department if any problems should occur.  Please show the CHEMOTHERAPY ALERT CARD or IMMUNOTHERAPY ALERT CARD at check-in to the Emergency  Department and triage nurse.  Should you have questions after your visit or need to cancel or reschedule your appointment, please contact [facility] [phone]  and follow the prompts.  Office hours are 8:00 a.m. to 4:30 p.m. Monday - Friday. Please note that voicemails left after 4:00 p.m. may not be returned until the following business day.  We are closed weekends and major holidays. You have access to a nurse at all times for urgent questions. Please call the main number to the clinic [phone] and follow the prompts.  For any non-urgent questions, you may also contact your provider using MyChart. We now offer e-Visits for anyone 18 and older to request care online for non-urgent symptoms. For details visit [email or web address].   Also download the MyChart app! Go to the app store, search "MyChart", open the app, select , and log in with your MyChart username and password.  Due to Covid, a mask is required upon entering the hospital/clinic. If you do not have a mask, one will be given to you upon arrival. For doctor visits, patients may have 1 support person aged 18 or older with them. For treatment visits, patients cannot have anyone with them due to current Covid guidelines and our immunocompromised population.  

## 2021-01-31 NOTE — Progress Notes (Signed)
Patient presents for Venofer infusion per providers order.  Vital signs stable.    Peripheral IV started and blood return noted pre and post infusion.  Venofer infusion given today per MD orders.  Stable duirng infusion without adverse affects.  Vital signs stable.  No complaints at this time.  Discharge from clinic ambulatory in stable condition.  Alert and oriented X 3.  Follow up with [facility] as scheduled.

## 2021-02-06 MED FILL — Iron Sucrose Inj 20 MG/ML (Fe Equiv): INTRAVENOUS | Qty: 15 | Status: AC

## 2021-02-07 ENCOUNTER — Inpatient Hospital Stay (HOSPITAL_COMMUNITY): Payer: Medicare PPO

## 2021-02-07 ENCOUNTER — Other Ambulatory Visit: Payer: Self-pay

## 2021-02-07 VITALS — BP 111/65 | HR 69 | Temp 98.0°F | Resp 18

## 2021-02-07 DIAGNOSIS — D509 Iron deficiency anemia, unspecified: Secondary | ICD-10-CM

## 2021-02-07 DIAGNOSIS — D472 Monoclonal gammopathy: Secondary | ICD-10-CM | POA: Diagnosis not present

## 2021-02-07 MED ORDER — ACETAMINOPHEN 325 MG PO TABS
650.0000 mg | ORAL_TABLET | Freq: Once | ORAL | Status: AC
  Administered 2021-02-07: 650 mg via ORAL
  Filled 2021-02-07: qty 2

## 2021-02-07 MED ORDER — SODIUM CHLORIDE 0.9 % IV SOLN: Freq: Once | INTRAVENOUS | Status: AC

## 2021-02-07 MED ORDER — LORATADINE 10 MG PO TABS
10.0000 mg | ORAL_TABLET | Freq: Once | ORAL | Status: AC
  Administered 2021-02-07: 10 mg via ORAL
  Filled 2021-02-07: qty 1

## 2021-02-07 MED ORDER — SODIUM CHLORIDE 0.9 % IV SOLN
300.0000 mg | Freq: Once | INTRAVENOUS | Status: AC
  Administered 2021-02-07: 300 mg via INTRAVENOUS
  Filled 2021-02-07: qty 300

## 2021-02-07 NOTE — Progress Notes (Signed)
Patient presents for Venofer infusion per providers order.  Vital signs WNL.  Patient has no new complaints at this time. ° °Peripheral IV started and blood return noted pre and post infusion. ° °Venofer infusion given today per MD orders.  Stable during infusion without adverse affects.  Vital signs stable.  No complaints at this time.  Discharge from clinic ambulatory in stable condition.  Alert and oriented X 3.  Follow up with [facility] as scheduled.  ° ° °

## 2021-02-07 NOTE — Patient Instructions (Signed)
[facility]  Discharge Instructions: Thank you for choosing [facility] to provide your oncology and hematology care.  If you have a lab appointment with the Cancer Center, please come in thru the Main Entrance and check in at the main information desk.  Wear comfortable clothing and clothing appropriate for easy access to any Portacath or PICC line.   We strive to give you quality time with your provider. You may need to reschedule your appointment if you arrive late (15 or more minutes).  Arriving late affects you and other patients whose appointments are after yours.  Also, if you miss three or more appointments without notifying the office, you may be dismissed from the clinic at the provider's discretion.      For prescription refill requests, have your pharmacy contact our office and allow 72 hours for refills to be completed.    Today you received the following chemotherapy and/or immunotherapy agents Venofer      To help prevent nausea and vomiting after your treatment, we encourage you to take your nausea medication as directed.  BELOW ARE SYMPTOMS THAT SHOULD BE REPORTED IMMEDIATELY: *FEVER GREATER THAN 100.4 F (38 C) OR HIGHER *CHILLS OR SWEATING *NAUSEA AND VOMITING THAT IS NOT CONTROLLED WITH YOUR NAUSEA MEDICATION *UNUSUAL SHORTNESS OF BREATH *UNUSUAL BRUISING OR BLEEDING *URINARY PROBLEMS (pain or burning when urinating, or frequent urination) *BOWEL PROBLEMS (unusual diarrhea, constipation, pain near the anus) TENDERNESS IN MOUTH AND THROAT WITH OR WITHOUT PRESENCE OF ULCERS (sore throat, sores in mouth, or a toothache) UNUSUAL RASH, SWELLING OR PAIN  UNUSUAL VAGINAL DISCHARGE OR ITCHING   Items with * indicate a potential emergency and should be followed up as soon as possible or go to the Emergency Department if any problems should occur.  Please show the CHEMOTHERAPY ALERT CARD or IMMUNOTHERAPY ALERT CARD at check-in to the Emergency  Department and triage nurse.  Should you have questions after your visit or need to cancel or reschedule your appointment, please contact  CANCER CENTER [phone]  and follow the prompts.  Office hours are 8:00 a.m. to 4:30 p.m. Monday - Friday. Please note that voicemails left after 4:00 p.m. may not be returned until the following business day.  We are closed weekends and major holidays. You have access to a nurse at all times for urgent questions. Please call the main number to the clinic [phone] and follow the prompts.  For any non-urgent questions, you may also contact your provider using MyChart. We now offer e-Visits for anyone 18 and older to request care online for non-urgent symptoms. For details visit [email or web address].   Also download the MyChart app! Go to the app store, search "MyChart", open the app, select [facility] [place], and log in with your MyChart username and password.  Due to Covid, a mask is required upon entering the hospital/clinic. If you do not have a mask, one will be given to you upon arrival. For doctor visits, patients may have 1 support person aged 18 or older with them. For treatment visits, patients cannot have anyone with them due to current Covid guidelines and our immunocompromised population.  

## 2021-02-11 NOTE — Progress Notes (Signed)
HPI male former smoker followed for OSA, allergic rhinitis, asthma NPSG 12/28/06- AHI 5.4/ hr, desaturation to 90%, body weight   225 lbs -----------------------------------------------------------------   12/13/19- 86 year old male former smoker followed for OSA, Allergic Rhinitis, Asthma, complicated by DM 2, CKD3, Glaucoma, Monoclonal Gammopathy,  CPAP 5/ [facility] He called recently to replace old CPAP machine. He had trouble using for a while last year when he was up a lot at night caring for wife after her CVA. He does state that CPAP helps and he intends to continue using.  Wixela 100, Ventolin hfa, Dymista, Body weight today 216 lbs Asthma control has been comfortable with no significant flares. Wixela and Ventolin help and refills are requested.   02/12/21- 86 year old male former smoker followed for OSA, Allergic Rhinitis, Asthma, complicated by DM 2, CKD3, Glaucoma, Monoclonal Gammopathy,  Iron Anemia,  -Dymista, Wixela 100, Ventoliin hfa,  CPAP 5/ [facility] Download-4 Phizer Body weight today-215 lbs Covid vax-4 Phizer Flu vax- -----Not been wearing CPAP for 2-3 mths. Sob-same. He has decided not to use CPAP any longer-discussed.  OSA was minimal. Asthma control has been good.  Occasionally chest feels full with little cough or phlegm. Eating triggers runny nose and we considered trial of ipratropium nasal spray but he has glaucoma.   ROS-see HPI    + = positive Constitutional:   No-   weight loss, night sweats, fevers, chills, fatigue, lassitude. HEENT:   No-  headaches, difficulty swallowing, tooth/dental problems,  +sore throat,       No-  sneezing, itching, ear ache, nasal congestion, +post nasal drip,  CV:  No-   chest pain, orthopnea, PND, swelling in lower extremities, anasarca, dizziness, palpitations Resp: No-   shortness of breath with exertion or at rest.              No-   productive cough,  No non-productive cough,  No- coughing up of blood.              No-   change in  color of mucus.  No- wheezing.   Skin: No-   rash or lesions. GI:  No-   heartburn, indigestion, abdominal pain, nausea, vomiting,  GU: MS:  No-   joint pain or swelling.   Neuro-     nothing unusual Psych:  No- change in mood or affect. No depression or anxiety.  No memory loss.  OBJ General- Alert, Oriented, Affect-,, Distress- none acute, looks well Skin- rash-none, lesions- none, excoriation- none Lymphadenopathy- none Head- atraumatic            Eyes- Gross vision intact, PERRLA, conjunctivae clear secretions            Ears- Hearing, canals-normal            Nose- Clear, no-Septal dev, mucus, polyps, erosion, perforation             Throat- Mallampati III-IV , mucosa clear , drainage- none, tonsils- atrophic, Neck- flexible , trachea midline, no stridor , thyroid nl, carotid no bruit Chest - symmetrical excursion , unlabored           Heart/CV- RRR , no murmur , no gallop  , no rub, nl s1 s2                           - JVD- none , edema- none, stasis changes- none, varices- none           Lung- clear to  P&A, wheeze- none, cough- none , dullness-none, rub- none           Chest wall-  Abd-  Br/ Gen/ Rectal- Not done, not indicated Extrem- cyanosis- none, clubbing, none, atrophy- none, strength- nl Neuro- grossly intact to observation

## 2021-02-12 ENCOUNTER — Encounter: Payer: Self-pay | Admitting: Internal Medicine

## 2021-02-12 ENCOUNTER — Other Ambulatory Visit: Payer: Self-pay

## 2021-02-12 ENCOUNTER — Ambulatory Visit: Payer: Medicare PPO | Admitting: Internal Medicine

## 2021-02-12 VITALS — BP 134/68 | HR 80 | Temp 97.9°F | Ht 74.0 in | Wt 215.2 lb

## 2021-02-12 DIAGNOSIS — J4520 Mild intermittent asthma, uncomplicated: Secondary | ICD-10-CM | POA: Diagnosis not present

## 2021-02-12 DIAGNOSIS — G4733 Obstructive sleep apnea (adult) (pediatric): Secondary | ICD-10-CM

## 2021-02-12 NOTE — Patient Instructions (Signed)
I think it is ok for you to stop using CPAP now.  Order- DME [facility]- d/c CPAP  Ok to call or have the drug store call if you need your asthma inhalers refilled  It would be ok to go ahead and get the latest ("updated) Covid booster  Please call if we can help

## 2021-02-14 ENCOUNTER — Other Ambulatory Visit: Payer: Self-pay

## 2021-02-14 ENCOUNTER — Inpatient Hospital Stay (HOSPITAL_COMMUNITY): Payer: Medicare PPO | Attending: Hematology

## 2021-02-14 VITALS — BP 114/73 | HR 76 | Temp 97.7°F | Resp 16

## 2021-02-14 DIAGNOSIS — D509 Iron deficiency anemia, unspecified: Secondary | ICD-10-CM | POA: Diagnosis not present

## 2021-02-14 DIAGNOSIS — D472 Monoclonal gammopathy: Secondary | ICD-10-CM | POA: Diagnosis not present

## 2021-02-14 MED ORDER — SODIUM CHLORIDE 0.9 % IV SOLN: Freq: Once | INTRAVENOUS | Status: AC

## 2021-02-14 MED ORDER — SODIUM CHLORIDE 0.9 % IV SOLN
300.0000 mg | Freq: Once | INTRAVENOUS | Status: AC
  Administered 2021-02-14: 300 mg via INTRAVENOUS
  Filled 2021-02-14: qty 300

## 2021-02-14 MED ORDER — LORATADINE 10 MG PO TABS
10.0000 mg | ORAL_TABLET | Freq: Once | ORAL | Status: AC
  Administered 2021-02-14: 10 mg via ORAL
  Filled 2021-02-14: qty 1

## 2021-02-14 MED ORDER — ACETAMINOPHEN 325 MG PO TABS
650.0000 mg | ORAL_TABLET | Freq: Once | ORAL | Status: AC
  Administered 2021-02-14: 650 mg via ORAL
  Filled 2021-02-14: qty 2

## 2021-02-14 NOTE — Progress Notes (Signed)
Chaplain engaged in an initial visit with [name].  Chaplain and [name] learned about their similar connections from church to schooling.  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  Chaplain and [name] found connections around [name] they have been connected too.    Chaplain offered support, a check-in, and listening.     02/14/21 1000  Clinical Encounter Type  Visited With Patient  Visit Type Initial

## 2021-02-14 NOTE — Progress Notes (Signed)
Patient presents today for iron infusion.  Patient is in satisfactory condition with no complaints voiced.  Vital signs are stable.  We will proceed with treatment per MD orders.  Patient tolerated treatment well with no complaints voiced.  Patient left ambulatory in stable condition.  Vital signs stable at discharge.  Follow up as scheduled.    

## 2021-02-14 NOTE — Patient Instructions (Signed)
[facility]  Discharge Instructions: Thank you for choosing [facility] to provide your oncology and hematology care.  If you have a lab appointment with the Cancer Center, please come in thru the Main Entrance and check in at the main information desk.  Wear comfortable clothing and clothing appropriate for easy access to any Portacath or PICC line.   We strive to give you quality time with your provider. You may need to reschedule your appointment if you arrive late (15 or more minutes).  Arriving late affects you and other patients whose appointments are after yours.  Also, if you miss three or more appointments without notifying the office, you may be dismissed from the clinic at the provider's discretion.      For prescription refill requests, have your pharmacy contact our office and allow 72 hours for refills to be completed.        To help prevent nausea and vomiting after your treatment, we encourage you to take your nausea medication as directed.  BELOW ARE SYMPTOMS THAT SHOULD BE REPORTED IMMEDIATELY: *FEVER GREATER THAN 100.4 F (38 C) OR HIGHER *CHILLS OR SWEATING *NAUSEA AND VOMITING THAT IS NOT CONTROLLED WITH YOUR NAUSEA MEDICATION *UNUSUAL SHORTNESS OF BREATH *UNUSUAL BRUISING OR BLEEDING *URINARY PROBLEMS (pain or burning when urinating, or frequent urination) *BOWEL PROBLEMS (unusual diarrhea, constipation, pain near the anus) TENDERNESS IN MOUTH AND THROAT WITH OR WITHOUT PRESENCE OF ULCERS (sore throat, sores in mouth, or a toothache) UNUSUAL RASH, SWELLING OR PAIN  UNUSUAL VAGINAL DISCHARGE OR ITCHING   Items with * indicate a potential emergency and should be followed up as soon as possible or go to the Emergency Department if any problems should occur.  Please show the CHEMOTHERAPY ALERT CARD or IMMUNOTHERAPY ALERT CARD at check-in to the Emergency Department and triage nurse.  Should you have questions after your visit or need to cancel  or reschedule your appointment, please contact [facility] [phone]  and follow the prompts.  Office hours are 8:00 a.m. to 4:30 p.m. Monday - Friday. Please note that voicemails left after 4:00 p.m. may not be returned until the following business day.  We are closed weekends and major holidays. You have access to a nurse at all times for urgent questions. Please call the main number to the clinic [phone] and follow the prompts.  For any non-urgent questions, you may also contact your provider using MyChart. We now offer e-Visits for anyone 18 and older to request care online for non-urgent symptoms. For details visit [email or web address].   Also download the MyChart app! Go to the app store, search "MyChart", open the app, select [facility], and log in with your MyChart username and password.  Due to Covid, a mask is required upon entering the hospital/clinic. If you do not have a mask, one will be given to you upon arrival. For doctor visits, patients may have 1 support person aged 18 or older with them. For treatment visits, patients cannot have anyone with them due to current Covid guidelines and our immunocompromised population.  

## 2021-02-16 ENCOUNTER — Other Ambulatory Visit: Payer: Self-pay | Admitting: Internal Medicine

## 2021-05-19 ENCOUNTER — Encounter: Payer: Self-pay | Admitting: Internal Medicine

## 2021-05-19 NOTE — Assessment & Plan Note (Signed)
Okay to stop CPAP.  He had minimal OSA and has lost some weight since sleep study was done.  CPAP was not improving his quality of life. Plan-DC CPAP

## 2021-05-19 NOTE — Assessment & Plan Note (Signed)
Mild intermittent uncomplicated. Plan-refill inhalers as needed

## 2021-05-22 ENCOUNTER — Inpatient Hospital Stay (HOSPITAL_COMMUNITY): Payer: Medicare PPO | Attending: Hematology

## 2021-05-22 DIAGNOSIS — D509 Iron deficiency anemia, unspecified: Secondary | ICD-10-CM | POA: Diagnosis present

## 2021-05-22 DIAGNOSIS — E1122 Type 2 diabetes mellitus with diabetic chronic kidney disease: Secondary | ICD-10-CM | POA: Diagnosis not present

## 2021-05-22 DIAGNOSIS — I129 Hypertensive chronic kidney disease with stage 1 through stage 4 chronic kidney disease, or unspecified chronic kidney disease: Secondary | ICD-10-CM | POA: Diagnosis not present

## 2021-05-22 DIAGNOSIS — R200 Anesthesia of skin: Secondary | ICD-10-CM | POA: Insufficient documentation

## 2021-05-22 DIAGNOSIS — N184 Chronic kidney disease, stage 4 (severe): Secondary | ICD-10-CM | POA: Insufficient documentation

## 2021-05-22 DIAGNOSIS — D472 Monoclonal gammopathy: Secondary | ICD-10-CM | POA: Insufficient documentation

## 2021-05-22 LAB — COMPREHENSIVE METABOLIC PANEL
ALT: 15 U/L (ref 0–44)
AST: 19 U/L (ref 15–41)
Albumin: 4.1 g/dL (ref 3.5–5.0)
Alkaline Phosphatase: 72 U/L (ref 38–126)
Anion gap: 5 (ref 5–15)
BUN: 26 mg/dL — ABNORMAL HIGH (ref 8–23)
CO2: 26 mmol/L (ref 22–32)
Calcium: 9.4 mg/dL (ref 8.9–10.3)
Chloride: 108 mmol/L (ref 98–111)
Creatinine, Ser: 2.15 mg/dL — ABNORMAL HIGH (ref 0.61–1.24)
GFR, Estimated: 30 mL/min — ABNORMAL LOW (ref >60)
Glucose, Bld: 92 mg/dL (ref 70–99)
Potassium: 4.6 mmol/L (ref 3.5–5.1)
Sodium: 139 mmol/L (ref 135–145)
Total Bilirubin: 0.8 mg/dL (ref 0.3–1.2)
Total Protein: 7.6 g/dL (ref 6.5–8.1)

## 2021-05-22 LAB — CBC WITH DIFFERENTIAL/PLATELET
Abs Immature Granulocytes: 0.01 10*3/uL (ref 0.00–0.07)
Basophils Absolute: 0 10*3/uL (ref 0.0–0.1)
Basophils Relative: 0 %
Eosinophils Absolute: 0.1 10*3/uL (ref 0.0–0.5)
Eosinophils Relative: 2 %
HCT: 39.1 % (ref 39.0–52.0)
Hemoglobin: 12.3 g/dL — ABNORMAL LOW (ref 13.0–17.0)
Immature Granulocytes: 0 %
Lymphocytes Relative: 31 %
Lymphs Abs: 1.6 10*3/uL (ref 0.7–4.0)
MCH: 29.6 pg (ref 26.0–34.0)
MCHC: 31.5 g/dL (ref 30.0–36.0)
MCV: 94 fL (ref 80.0–100.0)
Monocytes Absolute: 0.4 10*3/uL (ref 0.1–1.0)
Monocytes Relative: 8 %
Neutro Abs: 3 10*3/uL (ref 1.7–7.7)
Neutrophils Relative %: 59 %
Platelets: 166 10*3/uL (ref 150–400)
RBC: 4.16 MIL/uL — ABNORMAL LOW (ref 4.22–5.81)
RDW: 14.6 % (ref 11.5–15.5)
WBC: 5.1 10*3/uL (ref 4.0–10.5)
nRBC: 0 % (ref 0.0–0.2)

## 2021-05-22 LAB — IRON AND TIBC
Iron: 83 ug/dL (ref 45–182)
Saturation Ratios: 25 % (ref 17.9–39.5)
TIBC: 326 ug/dL (ref 250–450)
UIBC: 243 ug/dL

## 2021-05-22 LAB — LACTATE DEHYDROGENASE: LDH: 156 U/L (ref 98–192)

## 2021-05-22 LAB — FERRITIN: Ferritin: 144 ng/mL (ref 24–336)

## 2021-05-26 LAB — PROTEIN ELECTROPHORESIS, SERUM
A/G Ratio: 1.2 (ref 0.7–1.7)
Albumin ELP: 4.1 g/dL (ref 2.9–4.4)
Alpha-1-Globulin: 0.2 g/dL (ref 0.0–0.4)
Alpha-2-Globulin: 0.8 g/dL (ref 0.4–1.0)
Beta Globulin: 0.7 g/dL (ref 0.7–1.3)
Gamma Globulin: 1.6 g/dL (ref 0.4–1.8)
Globulin, Total: 3.3 g/dL (ref 2.2–3.9)
M-Spike, %: 1.1 g/dL — ABNORMAL HIGH
Total Protein ELP: 7.4 g/dL (ref 6.0–8.5)

## 2021-05-26 LAB — KAPPA/LAMBDA LIGHT CHAINS
Kappa free light chain: 106.8 mg/L — ABNORMAL HIGH (ref 3.3–19.4)
Kappa, lambda light chain ratio: 6.28 — ABNORMAL HIGH (ref 0.26–1.65)
Lambda free light chains: 17 mg/L (ref 5.7–26.3)

## 2021-05-28 ENCOUNTER — Ambulatory Visit
Admission: RE | Admit: 2021-05-28 | Discharge: 2021-05-28 | Disposition: A | Discharge status: Home / Self Care | Payer: Medicare PPO | Source: Ambulatory Visit | Attending: Internal Medicine | Admitting: Internal Medicine

## 2021-05-28 ENCOUNTER — Other Ambulatory Visit: Payer: Self-pay | Admitting: Internal Medicine

## 2021-05-28 DIAGNOSIS — R079 Chest pain, unspecified: Secondary | ICD-10-CM
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
## 2021-05-29 ENCOUNTER — Other Ambulatory Visit: Payer: Self-pay

## 2021-05-29 ENCOUNTER — Inpatient Hospital Stay (HOSPITAL_COMMUNITY): Payer: Medicare PPO | Admitting: Physician Assistant

## 2021-05-29 VITALS — BP 131/60 | HR 77 | Temp 97.9°F | Resp 18 | Ht 74.0 in | Wt 208.1 lb

## 2021-05-29 DIAGNOSIS — N1832 Chronic kidney disease, stage 3b: Secondary | ICD-10-CM

## 2021-05-29 DIAGNOSIS — D509 Iron deficiency anemia, unspecified: Secondary | ICD-10-CM | POA: Diagnosis not present

## 2021-05-29 DIAGNOSIS — D631 Anemia in chronic kidney disease: Secondary | ICD-10-CM

## 2021-05-29 DIAGNOSIS — D472 Monoclonal gammopathy: Secondary | ICD-10-CM

## 2021-05-29 NOTE — Patient Instructions (Signed)
[facility] Discharge Instructions  You were seen today by Tarri Abernethy PA-C for your iron deficiency anemia and your abnormal protein (MGUS).  Your blood and protein levels remain stable.  We will check them again at your follow-up visit.  LABS: Return in 6 months for repeat labs.  We will also check Xrays of your whole body.  FOLLOW-UP APPOINTMENT: Office visit in 6 months   Thank you for choosing [facility] to provide your oncology and hematology care.  To afford each patient quality time with our provider, please arrive at least 15 minutes before your scheduled appointment time.   If you have a lab appointment with the [facility] please come in thru the Main Entrance and check in at the main information desk.  You need to re-schedule your appointment should you arrive 10 or more minutes late.  We strive to give you quality time with our providers, and arriving late affects you and other patients whose appointments are after yours.  Also, if you no show three or more times for appointments you may be dismissed from the clinic at the providers discretion.     Again, thank you for choosing [facility].  Our hope is that these requests will decrease the amount of time that you wait before being seen by our physicians.       _____________________________________________________________  Should you have questions after your visit to [facility], please contact our office at [phone] and follow the prompts.  Our office hours are 8:00 a.m. and 4:30 p.m. Monday - Friday.  Please note that voicemails left after 4:00 p.m. may not be returned until the following business day.  We are closed weekends and major holidays.  You do have access to a nurse 24-7, just call the main number to the clinic [phone] and do not press any options, hold on the line and a nurse will answer the phone.     For prescription refill requests, have your pharmacy contact our office and allow 72 hours.    Due to Covid, you will need to wear a mask upon entering the hospital. If you do not have a mask, a mask will be given to you at the Main Entrance upon arrival. For doctor visits, patients may have 1 support person age 53 or older with them. For treatment visits, patients can not have anyone with them due to social distancing guidelines and our immunocompromised population.

## 2021-09-27 ENCOUNTER — Other Ambulatory Visit: Payer: Self-pay | Admitting: Internal Medicine

## 2021-10-01 ENCOUNTER — Telehealth: Payer: Self-pay | Admitting: Internal Medicine

## 2021-10-01 MED ORDER — FLUTICASONE-SALMETEROL 100-50 MCG/ACT IN AEPB: INHALATION_SPRAY | RESPIRATORY_TRACT | 11 refills | Status: DC

## 2021-10-01 NOTE — Telephone Encounter (Signed)
Rx for pt's [name] has been sent to pharmacy for pt. Called and spoke with pt letting him know this had been sent in and he verbalized understanding. Nothing further needed.

## 2021-11-01 IMAGING — DX DG ABDOMEN 1V
2 series · 2 of 2 positions shown · non-contrast
Comparison: 7011

CLINICAL DATA: Abdominal pain, bloating

EXAM:
ABDOMEN - 1 VIEW

[dg abd 1 view (1 of 2)]
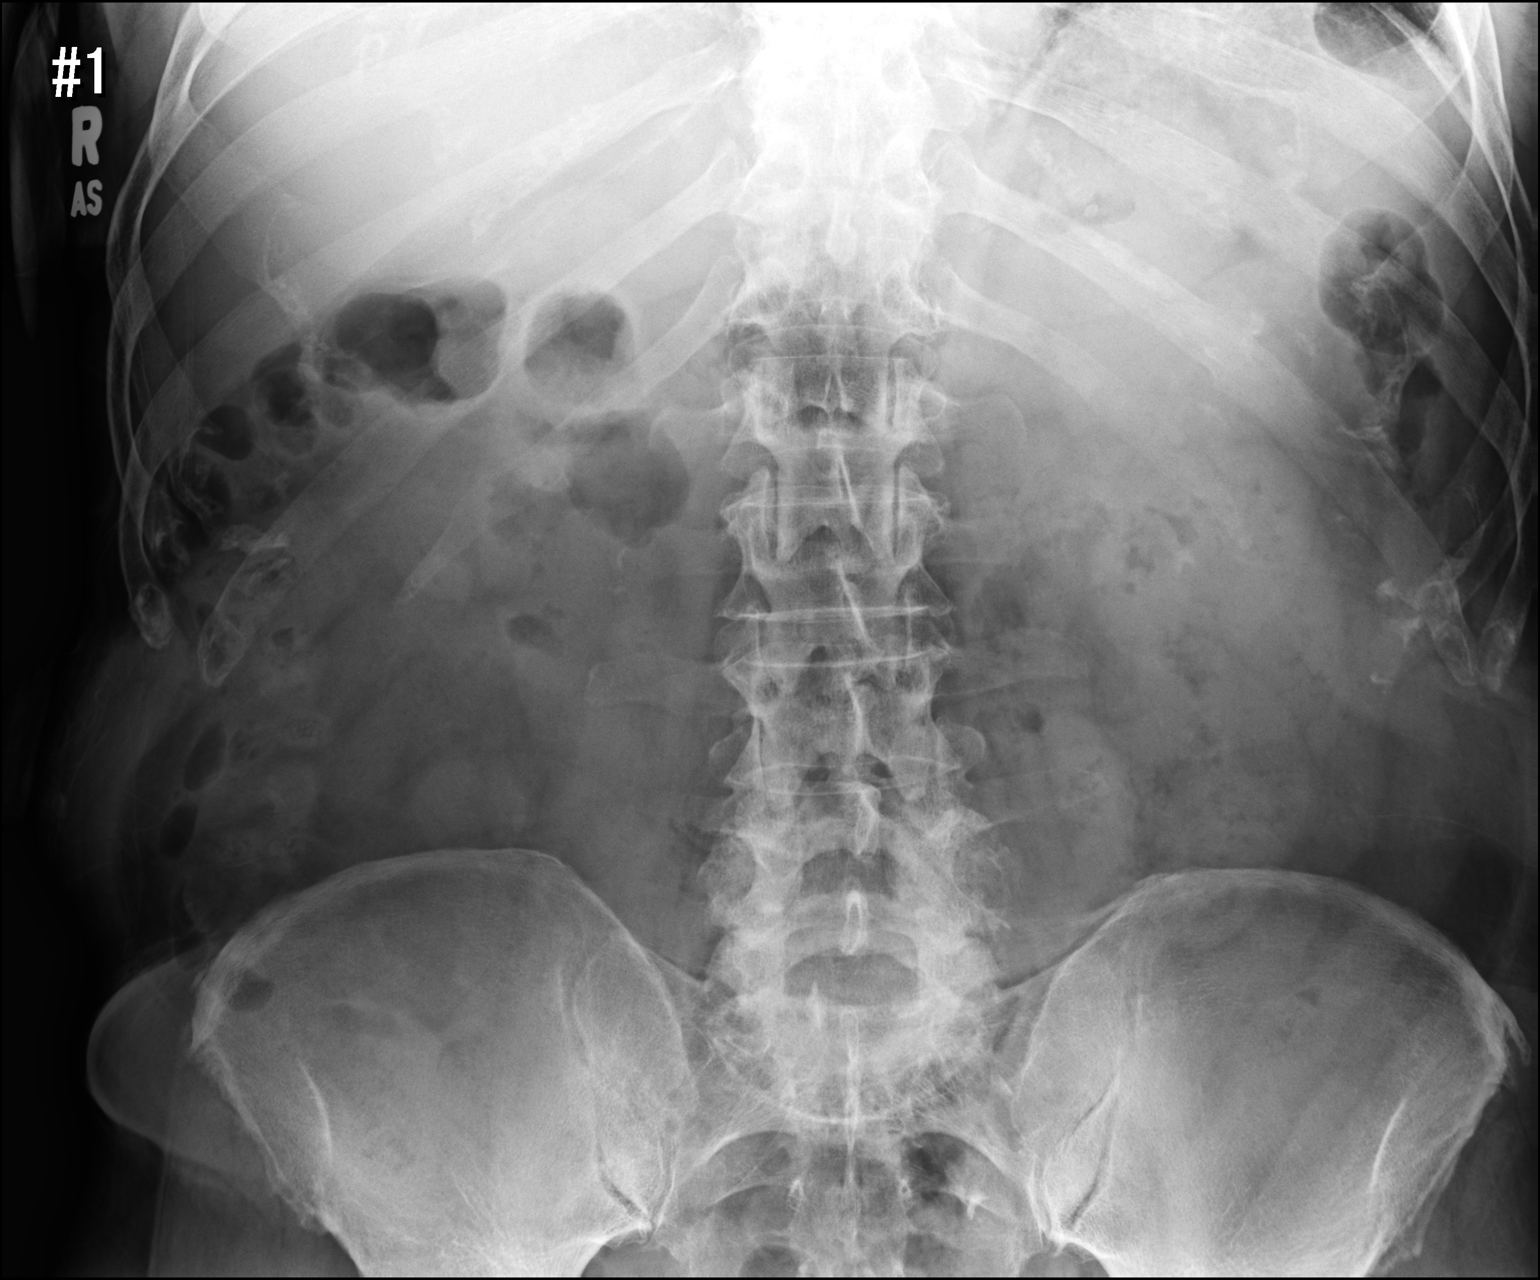

[dg abd 1 view (2 of 2)]
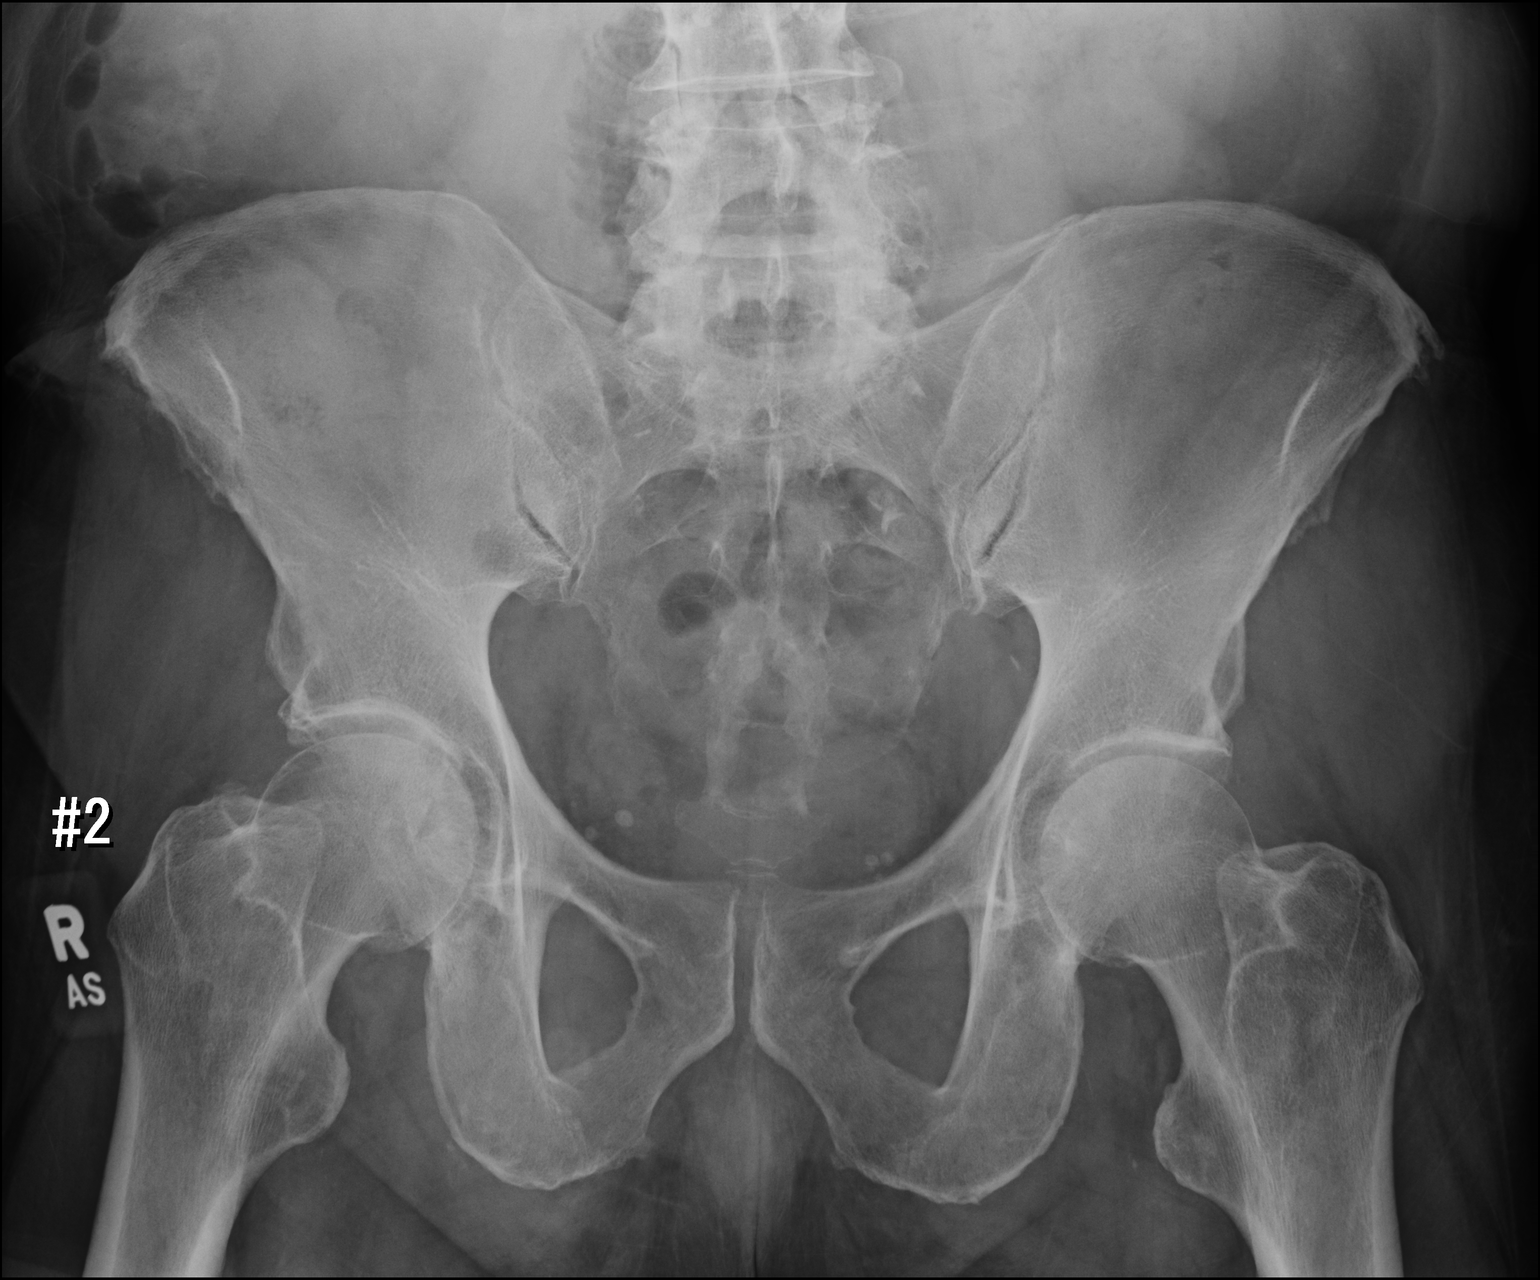

[2 of 2 positions shown; findings below may reference images not displayed]

FINDINGS: Normal bowel gas pattern. Stool burden is mild. No radiopaque
calculi.
IMPRESSION: Normal bowel gas pattern.

## 2021-11-20 ENCOUNTER — Ambulatory Visit (HOSPITAL_COMMUNITY)
Admission: RE | Admit: 2021-11-20 | Discharge: 2021-11-20 | Disposition: A | Discharge status: Home / Self Care | Payer: Medicare PPO | Source: Ambulatory Visit | Attending: Physician Assistant | Admitting: Physician Assistant

## 2021-11-20 ENCOUNTER — Inpatient Hospital Stay: Payer: Medicare PPO | Attending: Physician Assistant

## 2021-11-20 DIAGNOSIS — D472 Monoclonal gammopathy: Secondary | ICD-10-CM | POA: Diagnosis present

## 2021-11-20 DIAGNOSIS — E1122 Type 2 diabetes mellitus with diabetic chronic kidney disease: Secondary | ICD-10-CM | POA: Insufficient documentation

## 2021-11-20 DIAGNOSIS — D509 Iron deficiency anemia, unspecified: Secondary | ICD-10-CM | POA: Insufficient documentation

## 2021-11-20 DIAGNOSIS — N1832 Chronic kidney disease, stage 3b: Secondary | ICD-10-CM | POA: Insufficient documentation

## 2021-11-27 ENCOUNTER — Ambulatory Visit: Payer: Medicare PPO | Admitting: Physician Assistant

## 2021-11-28 ENCOUNTER — Inpatient Hospital Stay: Payer: Medicare PPO

## 2021-12-02 ENCOUNTER — Inpatient Hospital Stay: Payer: Medicare PPO

## 2021-12-02 DIAGNOSIS — N1832 Chronic kidney disease, stage 3b: Secondary | ICD-10-CM | POA: Diagnosis not present

## 2021-12-02 DIAGNOSIS — E1122 Type 2 diabetes mellitus with diabetic chronic kidney disease: Secondary | ICD-10-CM | POA: Diagnosis not present

## 2021-12-02 DIAGNOSIS — D509 Iron deficiency anemia, unspecified: Secondary | ICD-10-CM

## 2021-12-02 DIAGNOSIS — D631 Anemia in chronic kidney disease: Secondary | ICD-10-CM

## 2021-12-02 DIAGNOSIS — D472 Monoclonal gammopathy: Secondary | ICD-10-CM

## 2021-12-02 LAB — CBC WITH DIFFERENTIAL/PLATELET
Abs Immature Granulocytes: 0.02 10*3/uL (ref 0.00–0.07)
Basophils Absolute: 0 10*3/uL (ref 0.0–0.1)
Basophils Relative: 0 %
Eosinophils Absolute: 0.1 10*3/uL (ref 0.0–0.5)
Eosinophils Relative: 1 %
HCT: 42.9 % (ref 39.0–52.0)
Hemoglobin: 13.8 g/dL (ref 13.0–17.0)
Immature Granulocytes: 0 %
Lymphocytes Relative: 23 %
Lymphs Abs: 1.2 10*3/uL (ref 0.7–4.0)
MCH: 29.8 pg (ref 26.0–34.0)
MCHC: 32.2 g/dL (ref 30.0–36.0)
MCV: 92.7 fL (ref 80.0–100.0)
Monocytes Absolute: 0.3 10*3/uL (ref 0.1–1.0)
Monocytes Relative: 5 %
Neutro Abs: 3.8 10*3/uL (ref 1.7–7.7)
Neutrophils Relative %: 71 %
Platelets: 187 10*3/uL (ref 150–400)
RBC: 4.63 MIL/uL (ref 4.22–5.81)
RDW: 14.5 % (ref 11.5–15.5)
WBC: 5.4 10*3/uL (ref 4.0–10.5)
nRBC: 0 % (ref 0.0–0.2)

## 2021-12-02 LAB — COMPREHENSIVE METABOLIC PANEL
ALT: 25 U/L (ref 0–44)
AST: 25 U/L (ref 15–41)
Albumin: 3.9 g/dL (ref 3.5–5.0)
Alkaline Phosphatase: 73 U/L (ref 38–126)
Anion gap: 4 — ABNORMAL LOW (ref 5–15)
BUN: 24 mg/dL — ABNORMAL HIGH (ref 8–23)
CO2: 27 mmol/L (ref 22–32)
Calcium: 9.2 mg/dL (ref 8.9–10.3)
Chloride: 107 mmol/L (ref 98–111)
Creatinine, Ser: 2.12 mg/dL — ABNORMAL HIGH (ref 0.61–1.24)
GFR, Estimated: 30 mL/min — ABNORMAL LOW (ref >60)
Glucose, Bld: 120 mg/dL — ABNORMAL HIGH (ref 70–99)
Potassium: 4.3 mmol/L (ref 3.5–5.1)
Sodium: 138 mmol/L (ref 135–145)
Total Bilirubin: 0.9 mg/dL (ref 0.3–1.2)
Total Protein: 8 g/dL (ref 6.5–8.1)

## 2021-12-02 LAB — IRON AND TIBC
Iron: 89 ug/dL (ref 45–182)
Saturation Ratios: 27 % (ref 17.9–39.5)
TIBC: 329 ug/dL (ref 250–450)
UIBC: 240 ug/dL

## 2021-12-02 LAB — FERRITIN: Ferritin: 76 ng/mL (ref 24–336)

## 2021-12-02 LAB — LACTATE DEHYDROGENASE: LDH: 152 U/L (ref 98–192)

## 2021-12-03 LAB — KAPPA/LAMBDA LIGHT CHAINS
Kappa free light chain: 90.3 mg/L — ABNORMAL HIGH (ref 3.3–19.4)
Kappa, lambda light chain ratio: 6.36 — ABNORMAL HIGH (ref 0.26–1.65)
Lambda free light chains: 14.2 mg/L (ref 5.7–26.3)

## 2021-12-04 LAB — PROTEIN ELECTROPHORESIS, SERUM
A/G Ratio: 1.1 (ref 0.7–1.7)
Albumin ELP: 4 g/dL (ref 2.9–4.4)
Alpha-1-Globulin: 0.2 g/dL (ref 0.0–0.4)
Alpha-2-Globulin: 0.9 g/dL (ref 0.4–1.0)
Beta Globulin: 0.8 g/dL (ref 0.7–1.3)
Gamma Globulin: 1.7 g/dL (ref 0.4–1.8)
Globulin, Total: 3.5 g/dL (ref 2.2–3.9)
M-Spike, %: 1.2 g/dL — ABNORMAL HIGH
Total Protein ELP: 7.5 g/dL (ref 6.0–8.5)

## 2021-12-05 ENCOUNTER — Inpatient Hospital Stay: Payer: Medicare PPO | Admitting: Physician Assistant

## 2021-12-05 VITALS — BP 110/71 | HR 88 | Temp 98.6°F | Resp 16 | Ht 74.0 in | Wt 199.1 lb

## 2021-12-05 DIAGNOSIS — D631 Anemia in chronic kidney disease: Secondary | ICD-10-CM

## 2021-12-05 DIAGNOSIS — N1832 Chronic kidney disease, stage 3b: Secondary | ICD-10-CM

## 2021-12-05 DIAGNOSIS — D509 Iron deficiency anemia, unspecified: Secondary | ICD-10-CM

## 2021-12-05 DIAGNOSIS — D472 Monoclonal gammopathy: Secondary | ICD-10-CM
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
## 2021-12-05 NOTE — Patient Instructions (Signed)
[facility] Discharge Instructions  You were seen today by Tarri Abernethy PA-C for your iron deficiency anemia and your abnormal protein (MGUS).  Your blood and protein levels remain stable.  You do not have any sign of multiple myeloma cancer at this time.  We will check these labs again at your follow-up visit in 6 months.  Your blood levels look great!  However, your iron levels are slightly low.  This may be contributing to some of your fatigue and tiredness.  We will schedule you for IV iron x1 dose to improve your iron levels and energy levels.  LABS: Return in 6 months for repeat labs.   FOLLOW-UP APPOINTMENT: Office visit in 6 months   - - - - - - - - - - - - - - - - - -    Thank you for choosing [facility] to provide your oncology and hematology care.  To afford each patient quality time with our provider, please arrive at least 15 minutes before your scheduled appointment time.   If you have a lab appointment with the [facility] please come in thru the Main Entrance and check in at the main information desk.  You need to re-schedule your appointment should you arrive 10 or more minutes late.  We strive to give you quality time with our providers, and arriving late affects you and other patients whose appointments are after yours.  Also, if you no show three or more times for appointments you may be dismissed from the clinic at the providers discretion.     Again, thank you for choosing [facility].  Our hope is that these requests will decrease the amount of time that you wait before being seen by our physicians.       _____________________________________________________________  Should you have questions after your visit to [facility], please contact our office at [phone] and follow the prompts.  Our office hours are 8:00 a.m. and 4:30 p.m. Monday - Friday.  Please  note that voicemails left after 4:00 p.m. may not be returned until the following business day.  We are closed weekends and major holidays.  You do have access to a nurse 24-7, just call the main number to the clinic [phone] and do not press any options, hold on the line and a nurse will answer the phone.    For prescription refill requests, have your pharmacy contact our office and allow 72 hours.    Due to Covid, you will need to wear a mask upon entering the hospital. If you do not have a mask, a mask will be given to you at the Main Entrance upon arrival. For doctor visits, patients may have 1 support person age 90 or older with them. For treatment visits, patients can not have anyone with them due to social distancing guidelines and our immunocompromised population.

## 2021-12-09 ENCOUNTER — Inpatient Hospital Stay: Payer: Medicare PPO

## 2021-12-22 ENCOUNTER — Inpatient Hospital Stay: Payer: Medicare PPO | Attending: Physician Assistant

## 2021-12-22 VITALS — BP 109/63 | HR 77 | Temp 97.9°F | Resp 18

## 2021-12-22 DIAGNOSIS — D509 Iron deficiency anemia, unspecified: Secondary | ICD-10-CM | POA: Diagnosis present

## 2021-12-22 MED ORDER — SODIUM CHLORIDE 0.9 % IV SOLN: Freq: Once | INTRAVENOUS | Status: AC

## 2021-12-22 MED ORDER — SODIUM CHLORIDE 0.9 % IV SOLN
400.0000 mg | Freq: Once | INTRAVENOUS | Status: AC
  Administered 2021-12-22: 400 mg via INTRAVENOUS
  Filled 2021-12-22: qty 20

## 2021-12-22 MED ORDER — LORATADINE 10 MG PO TABS
10.0000 mg | ORAL_TABLET | Freq: Once | ORAL | Status: AC
  Administered 2021-12-22: 10 mg via ORAL
  Filled 2021-12-22: qty 1

## 2021-12-22 MED ORDER — ACETAMINOPHEN 325 MG PO TABS
650.0000 mg | ORAL_TABLET | Freq: Once | ORAL | Status: AC
  Administered 2021-12-22: 650 mg via ORAL
  Filled 2021-12-22: qty 2

## 2021-12-22 NOTE — Patient Instructions (Signed)
[facility]  Discharge Instructions: Thank you for choosing [facility] to provide your oncology and hematology care.  If you have a lab appointment with the Cancer Center, please come in thru the Main Entrance and check in at the main information desk.  Wear comfortable clothing and clothing appropriate for easy access to any Portacath or PICC line.   We strive to give you quality time with your provider. You may need to reschedule your appointment if you arrive late (15 or more minutes).  Arriving late affects you and other patients whose appointments are after yours.  Also, if you miss three or more appointments without notifying the office, you may be dismissed from the clinic at the provider's discretion.      For prescription refill requests, have your pharmacy contact our office and allow 72 hours for refills to be completed.    Today you received Venofer IV iron infusion.     BELOW ARE SYMPTOMS THAT SHOULD BE REPORTED IMMEDIATELY: *FEVER GREATER THAN 100.4 F (38 C) OR HIGHER *CHILLS OR SWEATING *NAUSEA AND VOMITING THAT IS NOT CONTROLLED WITH YOUR NAUSEA MEDICATION *UNUSUAL SHORTNESS OF BREATH *UNUSUAL BRUISING OR BLEEDING *URINARY PROBLEMS (pain or burning when urinating, or frequent urination) *BOWEL PROBLEMS (unusual diarrhea, constipation, pain near the anus) TENDERNESS IN MOUTH AND THROAT WITH OR WITHOUT PRESENCE OF ULCERS (sore throat, sores in mouth, or a toothache) UNUSUAL RASH, SWELLING OR PAIN  UNUSUAL VAGINAL DISCHARGE OR ITCHING   Items with * indicate a potential emergency and should be followed up as soon as possible or go to the Emergency Department if any problems should occur.  Please show the CHEMOTHERAPY ALERT CARD or IMMUNOTHERAPY ALERT CARD at check-in to the Emergency Department and triage nurse.  Should you have questions after your visit or need to cancel or reschedule your appointment, please contact [facility] [phone]  and follow the prompts.  Office hours are 8:00 a.m. to 4:30 p.m. Monday - Friday. Please note that voicemails left after 4:00 p.m. may not be returned until the following business day.  We are closed weekends and major holidays. You have access to a nurse at all times for urgent questions. Please call the main number to the clinic [phone] and follow the prompts.  For any non-urgent questions, you may also contact your provider using MyChart. We now offer e-Visits for anyone 18 and older to request care online for non-urgent symptoms. For details visit [email or web address].   Also download the MyChart app! Go to the app store, search "MyChart", open the app, select [facility], and log in with your MyChart username and password.  Masks are optional in the cancer centers. If you would like for your care team to wear a mask while they are taking care of you, please let them know. You may have one support person who is at least 85 years old accompany you for your appointments.  

## 2021-12-22 NOTE — Progress Notes (Signed)
Pt presents today for Venofer IV per provider's order. Vital signs stable and pt voiced no new complaints at this time.  Peripheral IV started with good blood return pre and post infusion.  Venofer 400 mg  given today per MD orders. Tolerated infusion without adverse affects. Vital signs stable. No complaints at this time. Discharged from clinic ambulatory in stable condition. Alert and oriented x 3. F/U with [facility] as scheduled.

## 2021-12-29 ENCOUNTER — Ambulatory Visit: Payer: Medicare PPO

## 2022-02-10 NOTE — Progress Notes (Unsigned)
HPI male former smoker followed for OSA, allergic rhinitis, asthma NPSG 12/28/06- AHI 5.4/ hr, desaturation to 90%, body weight   225 lbs -----------------------------------------------------------------   02/12/21- 87 year old male former smoker followed for OSA, Allergic Rhinitis, Asthma, complicated by DM 2, CKD3, Glaucoma, Monoclonal Gammopathy,  Iron Anemia,  -Dymista, Wixela 100, Ventoliin hfa,  CPAP 5/ [employer] Download-4 Phizer Body weight today-215 lbs Covid vax-4 Phizer Flu vax- -----Not been wearing CPAP for 2-3 mths. Sob-same. He has decided not to use CPAP any longer-discussed.  OSA was minimal. Asthma control has been good.  Occasionally chest feels full with little cough or phlegm. Eating triggers runny nose and we considered trial of ipratropium nasal spray but he has glaucoma.  02/12/22- 87 year old male former smoker followed for OSA/ quit CPAP, Allergic Rhinitis, Asthma, complicated by DM 2, CKD3, Glaucoma, Monoclonal Gammopathy,  Iron Anemia,  -Dymista, Wixela 100, Ventoliin hfa, azelastine nasal,  CPAP 5/ [employer]- patient quit using in 2022 Covid vax-4 Phizer Body weight today-193 lbs Covid vax-4 Phizer Flu vax-had Wife passed away 3 weeks ago.  His daughter is moving in with him.  He is quite subdued today but does not describe new problems.  He remains off of CPAP as discussed, having quit in 2022. No significant exacerbations of asthma.  Felt he had had a minimal cold a couple weeks ago which has resolved. To simplify things for him, I pointed out that his primary physician, Dr. Marlou Sa, can refill his inhalers as needed.  We will be happy to see him here at [facility] pulmonary again in the future if he should need [place].  ROS-see HPI    + = positive Constitutional:   No-   weight loss, night sweats, fevers, chills, fatigue, lassitude. HEENT:   No-  headaches, difficulty swallowing, tooth/dental problems,  +sore throat,       No-  sneezing, itching, ear ache, nasal congestion,  +post nasal drip,  CV:  No-   chest pain, orthopnea, PND, swelling in lower extremities, anasarca, dizziness, palpitations Resp: No-   shortness of breath with exertion or at rest.              No-   productive cough,  No non-productive cough,  No- coughing up of blood.              No-   change in color of mucus.  No- wheezing.   Skin: No-   rash or lesions. GI:  No-   heartburn, indigestion, abdominal pain, nausea, vomiting,  GU: MS:  No-   joint pain or swelling.   Neuro-     nothing unusual Psych:  No- change in mood or affect. No depression or anxiety.  No memory loss.  OBJ General- Alert, Oriented, Affect-,, Distress- none acute, looks well but subdued, Skin- rash-none, lesions- none, excoriation- none Lymphadenopathy- none Head- atraumatic            Eyes- Gross vision intact, PERRLA, conjunctivae clear secretions            Ears- Hearing, canals-normal            Nose- Clear, no-Septal dev, mucus, polyps, erosion, perforation             Throat- Mallampati III-IV , mucosa clear , drainage- none, tonsils- atrophic, Neck- flexible , trachea midline, no stridor , thyroid nl, carotid no bruit Chest - symmetrical excursion , unlabored           Heart/CV- RRR , no murmur , no  gallop  , no rub, nl s1 s2                           - JVD- none , edema- none, stasis changes- none, varices- none           Lung- clear to P&A, wheeze- none, cough- none , dullness-none, rub- none           Chest wall-  Abd-  Br/ Gen/ Rectal- Not done, not indicated Extrem- cyanosis- none, clubbing, none, atrophy- none, strength- nl Neuro- grossly intact to observation

## 2022-02-12 ENCOUNTER — Encounter: Payer: Self-pay | Admitting: Internal Medicine

## 2022-02-12 ENCOUNTER — Ambulatory Visit (INDEPENDENT_AMBULATORY_CARE_PROVIDER_SITE_OTHER): Payer: Medicare PPO | Admitting: Internal Medicine

## 2022-02-12 VITALS — BP 108/62 | HR 77 | Ht 74.0 in | Wt 193.6 lb

## 2022-02-12 DIAGNOSIS — J4520 Mild intermittent asthma, uncomplicated: Secondary | ICD-10-CM | POA: Diagnosis not present

## 2022-02-12 DIAGNOSIS — G4733 Obstructive sleep apnea (adult) (pediatric): Secondary | ICD-10-CM | POA: Diagnosis not present

## 2022-02-12 NOTE — Assessment & Plan Note (Signed)
He has lost some weight which helps.  He had made a personal decision to stop using CPAP at at his age I told him I would support what ever he wanted to do.

## 2022-02-12 NOTE — Patient Instructions (Signed)
Ok to continue current inhalers  We will be happy to see you back if you need our help with your breathing. Otherwise Dr Marlou Sa can take care of your asthma and allergy problems and refill your inhalers.

## 2022-02-12 NOTE — Assessment & Plan Note (Signed)
Mild intermittent uncomplicated.  Current medications are appropriate and can be refilled by [name].  We will be happy to see [name] again if we can be helpful.

## 2022-06-02 ENCOUNTER — Other Ambulatory Visit (HOSPITAL_COMMUNITY)
Admission: RE | Admit: 2022-06-02 | Discharge: 2022-06-02 | Disposition: A | Discharge status: Home / Self Care | Payer: Medicare PPO | Source: Ambulatory Visit | Attending: Internal Medicine | Admitting: Internal Medicine

## 2022-06-02 ENCOUNTER — Inpatient Hospital Stay: Payer: Medicare PPO | Attending: Hematology

## 2022-06-02 DIAGNOSIS — D472 Monoclonal gammopathy: Secondary | ICD-10-CM | POA: Diagnosis present

## 2022-06-02 DIAGNOSIS — D631 Anemia in chronic kidney disease: Secondary | ICD-10-CM | POA: Diagnosis not present

## 2022-06-02 DIAGNOSIS — E1169 Type 2 diabetes mellitus with other specified complication: Secondary | ICD-10-CM | POA: Diagnosis not present

## 2022-06-02 DIAGNOSIS — N1832 Chronic kidney disease, stage 3b: Secondary | ICD-10-CM | POA: Diagnosis present

## 2022-06-02 DIAGNOSIS — N184 Chronic kidney disease, stage 4 (severe): Secondary | ICD-10-CM | POA: Insufficient documentation

## 2022-06-02 DIAGNOSIS — D509 Iron deficiency anemia, unspecified: Secondary | ICD-10-CM | POA: Insufficient documentation

## 2022-06-02 LAB — COMPREHENSIVE METABOLIC PANEL
ALT: 26 U/L (ref 0–44)
AST: 21 U/L (ref 15–41)
Albumin: 3.9 g/dL (ref 3.5–5.0)
Alkaline Phosphatase: 74 U/L (ref 38–126)
Anion gap: 8 (ref 5–15)
BUN: 23 mg/dL (ref 8–23)
CO2: 26 mmol/L (ref 22–32)
Calcium: 9.2 mg/dL (ref 8.9–10.3)
Chloride: 104 mmol/L (ref 98–111)
Creatinine, Ser: 1.77 mg/dL — ABNORMAL HIGH (ref 0.61–1.24)
GFR, Estimated: 37 mL/min — ABNORMAL LOW (ref >60)
Glucose, Bld: 117 mg/dL — ABNORMAL HIGH (ref 70–99)
Potassium: 4.7 mmol/L (ref 3.5–5.1)
Sodium: 138 mmol/L (ref 135–145)
Total Bilirubin: 0.7 mg/dL (ref 0.3–1.2)
Total Protein: 7.6 g/dL (ref 6.5–8.1)

## 2022-06-02 LAB — CBC WITH DIFFERENTIAL/PLATELET
Abs Immature Granulocytes: 0.01 10*3/uL (ref 0.00–0.07)
Basophils Absolute: 0 10*3/uL (ref 0.0–0.1)
Basophils Relative: 1 %
Eosinophils Absolute: 0.2 10*3/uL (ref 0.0–0.5)
Eosinophils Relative: 3 %
HCT: 43.1 % (ref 39.0–52.0)
Hemoglobin: 13.7 g/dL (ref 13.0–17.0)
Immature Granulocytes: 0 %
Lymphocytes Relative: 21 %
Lymphs Abs: 1.3 10*3/uL (ref 0.7–4.0)
MCH: 29.7 pg (ref 26.0–34.0)
MCHC: 31.8 g/dL (ref 30.0–36.0)
MCV: 93.3 fL (ref 80.0–100.0)
Monocytes Absolute: 0.3 10*3/uL (ref 0.1–1.0)
Monocytes Relative: 5 %
Neutro Abs: 4.3 10*3/uL (ref 1.7–7.7)
Neutrophils Relative %: 70 %
Platelets: 204 10*3/uL (ref 150–400)
RBC: 4.62 MIL/uL (ref 4.22–5.81)
RDW: 15.2 % (ref 11.5–15.5)
WBC: 6 10*3/uL (ref 4.0–10.5)
nRBC: 0 % (ref 0.0–0.2)

## 2022-06-02 LAB — HEMOGLOBIN A1C
Hgb A1c MFr Bld: 6.6 % — ABNORMAL HIGH (ref 4.8–5.6)
Mean Plasma Glucose: 142.72 mg/dL

## 2022-06-02 LAB — IRON AND TIBC
Iron: 65 ug/dL (ref 45–182)
Saturation Ratios: 23 % (ref 17.9–39.5)
TIBC: 282 ug/dL (ref 250–450)
UIBC: 217 ug/dL

## 2022-06-02 LAB — LACTATE DEHYDROGENASE: LDH: 132 U/L (ref 98–192)

## 2022-06-02 LAB — FERRITIN: Ferritin: 213 ng/mL (ref 24–336)

## 2022-06-03 LAB — KAPPA/LAMBDA LIGHT CHAINS
Kappa free light chain: 105.2 mg/L — ABNORMAL HIGH (ref 3.3–19.4)
Kappa, lambda light chain ratio: 5.18 — ABNORMAL HIGH (ref 0.26–1.65)
Lambda free light chains: 20.3 mg/L (ref 5.7–26.3)

## 2022-06-04 LAB — PROTEIN ELECTROPHORESIS, SERUM
A/G Ratio: 1.2 (ref 0.7–1.7)
Albumin ELP: 3.8 g/dL (ref 2.9–4.4)
Alpha-1-Globulin: 0.2 g/dL (ref 0.0–0.4)
Alpha-2-Globulin: 0.9 g/dL (ref 0.4–1.0)
Beta Globulin: 0.7 g/dL (ref 0.7–1.3)
Gamma Globulin: 1.5 g/dL (ref 0.4–1.8)
Globulin, Total: 3.3 g/dL (ref 2.2–3.9)
M-Spike, %: 0.9 g/dL — ABNORMAL HIGH
Total Protein ELP: 7.1 g/dL (ref 6.0–8.5)
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
## 2022-06-09 ENCOUNTER — Inpatient Hospital Stay (HOSPITAL_BASED_OUTPATIENT_CLINIC_OR_DEPARTMENT_OTHER): Payer: Medicare PPO | Admitting: Physician Assistant

## 2022-06-09 ENCOUNTER — Other Ambulatory Visit: Payer: Self-pay

## 2022-06-09 VITALS — BP 100/63 | HR 77 | Temp 97.9°F | Resp 17 | Ht 74.0 in | Wt 188.2 lb

## 2022-06-09 DIAGNOSIS — D509 Iron deficiency anemia, unspecified: Secondary | ICD-10-CM | POA: Diagnosis not present

## 2022-06-09 DIAGNOSIS — D472 Monoclonal gammopathy: Secondary | ICD-10-CM

## 2022-06-09 DIAGNOSIS — R202 Paresthesia of skin: Secondary | ICD-10-CM | POA: Insufficient documentation

## 2022-06-09 DIAGNOSIS — R200 Anesthesia of skin: Secondary | ICD-10-CM | POA: Diagnosis not present

## 2022-06-09 DIAGNOSIS — I129 Hypertensive chronic kidney disease with stage 1 through stage 4 chronic kidney disease, or unspecified chronic kidney disease: Secondary | ICD-10-CM | POA: Insufficient documentation

## 2022-06-09 DIAGNOSIS — N1832 Chronic kidney disease, stage 3b: Secondary | ICD-10-CM

## 2022-06-09 DIAGNOSIS — M47816 Spondylosis without myelopathy or radiculopathy, lumbar region: Secondary | ICD-10-CM | POA: Insufficient documentation

## 2022-06-09 DIAGNOSIS — M47812 Spondylosis without myelopathy or radiculopathy, cervical region: Secondary | ICD-10-CM | POA: Diagnosis not present

## 2022-06-09 DIAGNOSIS — Z79899 Other long term (current) drug therapy: Secondary | ICD-10-CM | POA: Insufficient documentation

## 2022-06-09 DIAGNOSIS — Z7982 Long term (current) use of aspirin: Secondary | ICD-10-CM | POA: Diagnosis not present

## 2022-06-09 DIAGNOSIS — Z87891 Personal history of nicotine dependence: Secondary | ICD-10-CM | POA: Insufficient documentation

## 2022-06-09 DIAGNOSIS — D631 Anemia in chronic kidney disease: Secondary | ICD-10-CM

## 2022-06-09 DIAGNOSIS — Z7984 Long term (current) use of oral hypoglycemic drugs: Secondary | ICD-10-CM | POA: Insufficient documentation

## 2022-06-09 DIAGNOSIS — M47814 Spondylosis without myelopathy or radiculopathy, thoracic region: Secondary | ICD-10-CM | POA: Insufficient documentation

## 2022-06-09 DIAGNOSIS — E1122 Type 2 diabetes mellitus with diabetic chronic kidney disease: Secondary | ICD-10-CM | POA: Diagnosis not present

## 2022-06-09 NOTE — Patient Instructions (Signed)
[facility] Discharge Instructions  You were seen today by Tarri Abernethy PA-C for your iron deficiency anemia and your abnormal protein (MGUS).  Your blood and protein levels remain stable.  You do not have any sign of multiple myeloma cancer at this time.  We will check these labs again at your follow-up visit in 6 months.  Your blood and iron levels look great!  You do not need any IV iron  LABS: Return in 6 months for repeat labs and Xrays  FOLLOW-UP APPOINTMENT: Office visit in 6 months   - - - - - - - - - - - - - - - - - -    Thank you for choosing [facility] to provide your oncology and hematology care.  To afford each patient quality time with our provider, please arrive at least 15 minutes before your scheduled appointment time.   If you have a lab appointment with the [facility] please come in thru the Main Entrance and check in at the main information desk.  You need to re-schedule your appointment should you arrive 10 or more minutes late.  We strive to give you quality time with our providers, and arriving late affects you and other patients whose appointments are after yours.  Also, if you no show three or more times for appointments you may be dismissed from the clinic at the providers discretion.     Again, thank you for choosing [facility]  Our hope is that these requests will decrease the amount of time that you wait before being seen by our physicians.       _____________________________________________________________  Should you have questions after your visit to [facility], please contact our office at [phone] and follow the prompts.  Our office hours are 8:00 a.m. and 4:30 p.m. Monday - Friday.  Please note that voicemails left after 4:00 p.m. may not be returned until the following business day.  We are closed weekends and major holidays.  You do have  access to a nurse 24-7, just call the main number to the clinic [phone] and do not press any options, hold on the line and a nurse will answer the phone.    For prescription refill requests, have your pharmacy contact our office and allow 72 hours.    Due to Covid, you will need to wear a mask upon entering the hospital. If you do not have a mask, a mask will be given to you at the Main Entrance upon arrival. For doctor visits, patients may have 1 support person age 45 or older with them. For treatment visits, patients can not have anyone with them due to social distancing guidelines and our immunocompromised population.

## 2022-08-03 ENCOUNTER — Other Ambulatory Visit (HOSPITAL_COMMUNITY): Payer: Self-pay | Admitting: Internal Medicine

## 2022-08-03 DIAGNOSIS — N644 Mastodynia: Secondary | ICD-10-CM

## 2022-08-13 ENCOUNTER — Encounter (HOSPITAL_COMMUNITY): Payer: Self-pay

## 2022-08-13 ENCOUNTER — Ambulatory Visit (HOSPITAL_COMMUNITY)
Admission: RE | Admit: 2022-08-13 | Discharge: 2022-08-13 | Disposition: A | Discharge status: Home / Self Care | Payer: Medicare PPO | Source: Ambulatory Visit | Attending: Internal Medicine | Admitting: Internal Medicine

## 2022-08-13 DIAGNOSIS — N644 Mastodynia: Secondary | ICD-10-CM

## 2022-12-08 ENCOUNTER — Inpatient Hospital Stay: Payer: Medicare PPO

## 2022-12-15 ENCOUNTER — Inpatient Hospital Stay: Payer: Medicare PPO | Admitting: Physician Assistant

## 2022-12-28 ENCOUNTER — Inpatient Hospital Stay: Payer: Medicare PPO | Attending: Hematology

## 2022-12-28 ENCOUNTER — Ambulatory Visit (HOSPITAL_COMMUNITY)
Admission: RE | Admit: 2022-12-28 | Discharge: 2022-12-28 | Disposition: A | Discharge status: Home / Self Care | Payer: Medicare PPO | Source: Ambulatory Visit | Attending: Physician Assistant | Admitting: Physician Assistant

## 2022-12-28 DIAGNOSIS — D631 Anemia in chronic kidney disease: Secondary | ICD-10-CM | POA: Diagnosis not present

## 2022-12-28 DIAGNOSIS — D472 Monoclonal gammopathy: Secondary | ICD-10-CM | POA: Insufficient documentation

## 2022-12-28 DIAGNOSIS — N1832 Chronic kidney disease, stage 3b: Secondary | ICD-10-CM | POA: Diagnosis not present

## 2022-12-28 DIAGNOSIS — D509 Iron deficiency anemia, unspecified: Secondary | ICD-10-CM | POA: Insufficient documentation

## 2022-12-28 LAB — COMPREHENSIVE METABOLIC PANEL
ALT: 22 U/L (ref 0–44)
AST: 22 U/L (ref 15–41)
Albumin: 3.6 g/dL (ref 3.5–5.0)
Alkaline Phosphatase: 85 U/L (ref 38–126)
Anion gap: 9 (ref 5–15)
BUN: 24 mg/dL — ABNORMAL HIGH (ref 8–23)
CO2: 24 mmol/L (ref 22–32)
Calcium: 9 mg/dL (ref 8.9–10.3)
Chloride: 106 mmol/L (ref 98–111)
Creatinine, Ser: 2.02 mg/dL — ABNORMAL HIGH (ref 0.61–1.24)
GFR, Estimated: 32 mL/min — ABNORMAL LOW (ref >60)
Glucose, Bld: 199 mg/dL — ABNORMAL HIGH (ref 70–99)
Potassium: 4.2 mmol/L (ref 3.5–5.1)
Sodium: 139 mmol/L (ref 135–145)
Total Bilirubin: 0.4 mg/dL (ref 0.3–1.2)
Total Protein: 7 g/dL (ref 6.5–8.1)

## 2022-12-28 LAB — CBC WITH DIFFERENTIAL/PLATELET
Abs Immature Granulocytes: 0.01 10*3/uL (ref 0.00–0.07)
Basophils Absolute: 0 10*3/uL (ref 0.0–0.1)
Basophils Relative: 0 %
Eosinophils Absolute: 0.1 10*3/uL (ref 0.0–0.5)
Eosinophils Relative: 2 %
HCT: 40.5 % (ref 39.0–52.0)
Hemoglobin: 13.2 g/dL (ref 13.0–17.0)
Immature Granulocytes: 0 %
Lymphocytes Relative: 30 %
Lymphs Abs: 1.4 10*3/uL (ref 0.7–4.0)
MCH: 29.7 pg (ref 26.0–34.0)
MCHC: 32.6 g/dL (ref 30.0–36.0)
MCV: 91.2 fL (ref 80.0–100.0)
Monocytes Absolute: 0.4 10*3/uL (ref 0.1–1.0)
Monocytes Relative: 7 %
Neutro Abs: 2.8 10*3/uL (ref 1.7–7.7)
Neutrophils Relative %: 61 %
Platelets: 168 10*3/uL (ref 150–400)
RBC: 4.44 MIL/uL (ref 4.22–5.81)
RDW: 15.2 % (ref 11.5–15.5)
WBC: 4.7 10*3/uL (ref 4.0–10.5)
nRBC: 0 % (ref 0.0–0.2)

## 2022-12-28 LAB — IRON AND TIBC
Iron: 67 ug/dL (ref 45–182)
Saturation Ratios: 25 % (ref 17.9–39.5)
TIBC: 269 ug/dL (ref 250–450)
UIBC: 202 ug/dL

## 2022-12-28 LAB — LACTATE DEHYDROGENASE: LDH: 162 U/L (ref 98–192)

## 2022-12-28 LAB — FOLATE: Folate: 28.1 ng/mL (ref >5.9)

## 2022-12-28 LAB — VITAMIN B12: Vitamin B-12: 863 pg/mL (ref 180–914)

## 2022-12-28 LAB — FERRITIN: Ferritin: 129 ng/mL (ref 24–336)

## 2022-12-29 ENCOUNTER — Inpatient Hospital Stay: Payer: Medicare PPO

## 2022-12-29 LAB — KAPPA/LAMBDA LIGHT CHAINS
Kappa free light chain: 101.6 mg/L — ABNORMAL HIGH (ref 3.3–19.4)
Kappa, lambda light chain ratio: 6.2 — ABNORMAL HIGH (ref 0.26–1.65)
Lambda free light chains: 16.4 mg/L (ref 5.7–26.3)

## 2022-12-31 LAB — PROTEIN ELECTROPHORESIS, SERUM
A/G Ratio: 1 (ref 0.7–1.7)
Albumin ELP: 3.5 g/dL (ref 2.9–4.4)
Alpha-1-Globulin: 0.2 g/dL (ref 0.0–0.4)
Alpha-2-Globulin: 0.9 g/dL (ref 0.4–1.0)
Beta Globulin: 0.8 g/dL (ref 0.7–1.3)
Gamma Globulin: 1.5 g/dL (ref 0.4–1.8)
Globulin, Total: 3.4 g/dL (ref 2.2–3.9)
M-Spike, %: 1 g/dL — ABNORMAL HIGH
Total Protein ELP: 6.9 g/dL (ref 6.0–8.5)
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
## 2023-01-05 ENCOUNTER — Inpatient Hospital Stay: Payer: Medicare PPO | Admitting: Physician Assistant

## 2023-01-05 ENCOUNTER — Encounter: Payer: Self-pay | Admitting: Physician Assistant

## 2023-01-05 VITALS — BP 109/72 | HR 79 | Temp 98.3°F | Resp 18 | Wt 191.0 lb

## 2023-01-05 DIAGNOSIS — D472 Monoclonal gammopathy: Secondary | ICD-10-CM | POA: Diagnosis not present

## 2023-01-05 DIAGNOSIS — N1832 Chronic kidney disease, stage 3b: Secondary | ICD-10-CM

## 2023-01-05 DIAGNOSIS — D631 Anemia in chronic kidney disease: Secondary | ICD-10-CM | POA: Diagnosis not present

## 2023-01-05 DIAGNOSIS — D509 Iron deficiency anemia, unspecified: Secondary | ICD-10-CM

## 2023-01-05 NOTE — Patient Instructions (Signed)
[facility] at [facility] Discharge Instructions  You were seen today by Rojelio Brenner PA-C for your iron deficiency anemia and your abnormal protein (MGUS).  Your blood and protein levels remain stable.  You do not have any sign of multiple myeloma cancer at this time.  We will check these labs again at your follow-up visit in 6 months.  Your blood and iron levels look great!  You do not need any IV iron  LABS: Return in 6 months for repeat labs  FOLLOW-UP APPOINTMENT: Office visit in 6 months   - - - - - - - - - - - - - - - - - -    Thank you for choosing [facility] at [facility] to provide your oncology and hematology care.  To afford each patient quality time with our provider, please arrive at least 15 minutes before your scheduled appointment time.   If you have a lab appointment with the Cancer Center please come in thru the Main Entrance and check in at the main information desk.  You need to re-schedule your appointment should you arrive 10 or more minutes late.  We strive to give you quality time with our providers, and arriving late affects you and other patients whose appointments are after yours.  Also, if you no show three or more times for appointments you may be dismissed from the clinic at the providers discretion.     Again, thank you for choosing [facility].  Our hope is that these requests will decrease the amount of time that you wait before being seen by our physicians.       _____________________________________________________________  Should you have questions after your visit to [facility], please contact our office at [phone] and follow the prompts.  Our office hours are 8:00 a.m. and 4:30 p.m. Monday - Friday.  Please note that voicemails left after 4:00 p.m. may not be returned until the following business day.  We are closed weekends and major holidays.  You do have access to a  nurse 24-7, just call the main number to the clinic [phone] and do not press any options, hold on the line and a nurse will answer the phone.    For prescription refill requests, have your pharmacy contact our office and allow 72 hours.    Due to Covid, you will need to wear a mask upon entering the hospital. If you do not have a mask, a mask will be given to you at the Main Entrance upon arrival. For doctor visits, patients may have 1 support person age 82 or older with them. For treatment visits, patients can not have anyone with them due to social distancing guidelines and our immunocompromised population.

## 2023-01-10 ENCOUNTER — Other Ambulatory Visit: Payer: Self-pay | Admitting: Internal Medicine

## 2023-01-29 ENCOUNTER — Ambulatory Visit (INDEPENDENT_AMBULATORY_CARE_PROVIDER_SITE_OTHER): Payer: Medicare PPO | Admitting: Audiology

## 2023-01-29 ENCOUNTER — Ambulatory Visit (INDEPENDENT_AMBULATORY_CARE_PROVIDER_SITE_OTHER): Payer: Medicare PPO

## 2023-01-29 ENCOUNTER — Encounter (INDEPENDENT_AMBULATORY_CARE_PROVIDER_SITE_OTHER): Payer: Self-pay

## 2023-01-29 VITALS — Ht 72.0 in | Wt 192.0 lb

## 2023-01-29 DIAGNOSIS — J310 Chronic rhinitis: Secondary | ICD-10-CM

## 2023-01-29 DIAGNOSIS — H903 Sensorineural hearing loss, bilateral: Secondary | ICD-10-CM

## 2023-01-29 DIAGNOSIS — J343 Hypertrophy of nasal turbinates: Secondary | ICD-10-CM | POA: Diagnosis not present

## 2023-01-29 DIAGNOSIS — J342 Deviated nasal septum: Secondary | ICD-10-CM | POA: Diagnosis not present

## 2023-01-29 DIAGNOSIS — H6123 Impacted cerumen, bilateral: Secondary | ICD-10-CM | POA: Diagnosis not present

## 2023-01-29 NOTE — Progress Notes (Signed)
[address], [address] [place], [place] [address] [phone]  Audiological Evaluation    Name: [name]     DOB:   1934/11/08      MRN:   [id number]                                                                                     Service Date: 01/29/2023      Patient was referred today for a hearing evaluation by Dr. Karle Barr.   Symptoms Yes Details  Hearing loss  [x]  Patient reported perceiving hearing loss in both ears.  Tinnitus  []  Patient denied experiencing tinnitus.  Balance problems  []  Patient denied vertigo sensations.  Previous ear surgeries  []  Patient denied any previous ear surgeries.  Amplification  []  Patient denied the use of hearing aids.    Tympanogram: Right ear: Normal external ear canal volume with normal middle ear pressure and tympanic membrane compliance (Type A). Left ear: Normal external ear canal volume with normal middle ear pressure and high tympanic membrane compliance (Type Ad).    Hearing Evaluation: The audiogram was completed using conventional audiometric techniques under headphones with good reliability.   The hearing test results indicate: Right ear: Mild sensorineural hearing loss from [phone] Hz sloping to severe sensorineural hearing loss from 2000-8000 Hz. Left ear: Normal hearing sensitivity at 250 Hz gradually sloping to severe sensorineural hearing loss from [phone] Hz.   Speech Recognition Thresholds were obtained at 35 dBHL in the right ear and 35 dBHL masked in the left ear.   Word Recognition Testing was completed using the NU-6 word lists at 75 dBHL in the right ear and at 75 dBHL with 45 dBHL of masking noise in the left ear and the patient scored 80% in the right ear and 72% in the left ear.   Recommendations: Repeat audiogram when changes are perceived or per MD. Patient is a candidate for hearing amplification, consider a hearing aid evaluation.    Conley Rolls Davier Tramell, AUD, CCC-A 01/29/23

## 2023-01-31 DIAGNOSIS — J342 Deviated nasal septum: Secondary | ICD-10-CM | POA: Insufficient documentation

## 2023-01-31 DIAGNOSIS — H6123 Impacted cerumen, bilateral: Secondary | ICD-10-CM | POA: Insufficient documentation

## 2023-01-31 DIAGNOSIS — J343 Hypertrophy of nasal turbinates: Secondary | ICD-10-CM | POA: Insufficient documentation

## 2023-01-31 DIAGNOSIS — J310 Chronic rhinitis: Secondary | ICD-10-CM | POA: Insufficient documentation

## 2023-01-31 DIAGNOSIS — H903 Sensorineural hearing loss, bilateral: Secondary | ICD-10-CM | POA: Insufficient documentation

## 2023-01-31 NOTE — Progress Notes (Unsigned)
Patient ID: [name], male   DOB: [birth year], 88 y.o.   MRN: [id number]  Follow-up: Hearing loss, nasal drainage, nasal congestion, recurrent cerumen impaction  HPI: The patient is an 88 year old male who returns today for his follow-up evaluation.  The patient was previously seen for chronic nasal congestion, nasal drainage, hearing loss, and recurrent cerumen impaction.  He was last seen 6 months ago.  At that time, he was noted to have nasal mucosal congestion, nasal septal deviation, bilateral turbinate hypertrophy, postnasal drainage, and bilateral cerumen impaction.  He was treated with disimpaction and Atrovent nasal spray.  The patient returns today reporting occasional mild nasal congestion and postnasal drainage.  His symptoms have improved with the medical treatment.  He continues to have occasional nasal discomfort.  He is still having difficulty with his hearing.  He denies any otalgia, otorrhea, vertigo, facial pain, or fever.  Exam: General: Communicates without difficulty, well nourished, no acute distress. Head: Normocephalic, no evidence injury, no tenderness, facial buttresses intact without stepoff. Face/sinus: No tenderness to palpation and percussion. Facial movement is normal and symmetric. Eyes: PERRL, EOMI. No scleral icterus, conjunctivae clear. Neuro: CN II exam reveals vision grossly intact.  No nystagmus at any point of gaze. Auricles: Intact without lesions. EAC: Bilateral cerumen impaction.  Under the operating microscope, the cerumen is carefully removed with a combination of cerumen currette, alligator forceps, and suction catheters.  After the cerumen is removed, the TMs are noted to be normal. No mass, erythema, or lesions. Nose: External evaluation reveals normal support and skin without lesions.  Dorsum is intact.  Anterior rhinoscopy reveals congested mucosa over anterior aspect of inferior turbinates and intact septum.  No purulence noted. Oral:  Oral cavity and  oropharynx are intact, symmetric, without erythema or edema.  Mucosa is moist without lesions. Neck: Full range of motion without pain.  There is no significant lymphadenopathy.  No masses palpable.  Thyroid bed within normal limits to palpation.  Parotid glands and submandibular glands equal bilaterally without mass.  Trachea is midline. Neuro:  CN 2-12 grossly intact. Gait normal.   Procedure: Bilateral cerumen disimpaction Anesthesia: None Description: Under the operating microscope, the cerumen is carefully removed with a combination of cerumen currette, alligator forceps, and suction catheters.  After the cerumen is removed, the TMs are noted to be normal.  No mass, erythema, or lesions. The patient tolerated the procedure well.   Procedure:  Flexible Nasal Endoscopy: Description: Risks, benefits, and alternatives of flexible endoscopy were explained to the patient.  Specific mention was made of the risk of throat numbness with difficulty swallowing, possible bleeding from the nose and mouth, and pain from the procedure.  The patient gave oral consent to proceed.  The flexible scope was inserted into the right nasal cavity.  Endoscopy of the interior nasal cavity, superior, inferior, and middle meatus was performed. The sphenoid-ethmoid recess was examined. Edematous mucosa was noted.  No polyp, mass, or lesion was appreciated. Nasal septal deviation noted. Olfactory cleft was clear.  Nasopharynx with postnasal drainage.  Turbinates were hypertrophied but without mass.  The procedure was repeated on the contralateral side with similar findings.  The patient tolerated the procedure well.   His hearing test shows stable bilateral high-frequency sensorineural hearing loss.  Assessment 1.  Bilateral cerumen impaction.  After the disimpaction procedure, both tympanic membranes and middle ear spaces are noted to be normal.  2.  Chronic rhinitis with nasal mucosal congestion, nasal septal deviation,  bilateral inferior turbinate  hypertrophy, and chronic postnasal drainage. 3.  Stable bilateral high-frequency sensorineural hearing loss.  Plan: 1.  Otomicroscopy with bilateral cerumen disimpaction. 2.  The physical exam and nasal endoscopy findings are reviewed with the patient. 3.  Continue the use of Atrovent nasal spray.  A refill is given to the patient. 4.  Nasal saline irrigation is encouraged.   4.  The patient will return for reevaluation in 6 months.

## 2023-04-22 DIAGNOSIS — N5201 Erectile dysfunction due to arterial insufficiency: Secondary | ICD-10-CM | POA: Diagnosis not present

## 2023-05-06 ENCOUNTER — Encounter: Payer: Self-pay | Admitting: Hematology

## 2023-05-07 DIAGNOSIS — K5730 Diverticulosis of large intestine without perforation or abscess without bleeding: Secondary | ICD-10-CM | POA: Diagnosis not present

## 2023-05-07 DIAGNOSIS — N200 Calculus of kidney: Secondary | ICD-10-CM | POA: Diagnosis not present

## 2023-05-07 DIAGNOSIS — K859 Acute pancreatitis without necrosis or infection, unspecified: Secondary | ICD-10-CM | POA: Diagnosis not present

## 2023-05-07 DIAGNOSIS — K4090 Unilateral inguinal hernia, without obstruction or gangrene, not specified as recurrent: Secondary | ICD-10-CM | POA: Diagnosis not present

## 2023-05-18 DIAGNOSIS — N3941 Urge incontinence: Secondary | ICD-10-CM | POA: Diagnosis not present

## 2023-05-18 DIAGNOSIS — G4733 Obstructive sleep apnea (adult) (pediatric): Secondary | ICD-10-CM | POA: Diagnosis not present

## 2023-05-18 DIAGNOSIS — M1990 Unspecified osteoarthritis, unspecified site: Secondary | ICD-10-CM | POA: Diagnosis not present

## 2023-05-18 DIAGNOSIS — E1142 Type 2 diabetes mellitus with diabetic polyneuropathy: Secondary | ICD-10-CM | POA: Diagnosis not present

## 2023-05-18 DIAGNOSIS — Z8616 Personal history of COVID-19: Secondary | ICD-10-CM | POA: Diagnosis not present

## 2023-05-18 DIAGNOSIS — I509 Heart failure, unspecified: Secondary | ICD-10-CM | POA: Diagnosis not present

## 2023-05-18 DIAGNOSIS — N184 Chronic kidney disease, stage 4 (severe): Secondary | ICD-10-CM | POA: Diagnosis not present

## 2023-05-18 DIAGNOSIS — E1151 Type 2 diabetes mellitus with diabetic peripheral angiopathy without gangrene: Secondary | ICD-10-CM | POA: Diagnosis not present

## 2023-05-18 DIAGNOSIS — G309 Alzheimer's disease, unspecified: Secondary | ICD-10-CM | POA: Diagnosis not present

## 2023-05-18 DIAGNOSIS — E785 Hyperlipidemia, unspecified: Secondary | ICD-10-CM | POA: Diagnosis not present

## 2023-05-18 DIAGNOSIS — N3944 Nocturnal enuresis: Secondary | ICD-10-CM | POA: Diagnosis not present

## 2023-05-18 DIAGNOSIS — E1122 Type 2 diabetes mellitus with diabetic chronic kidney disease: Secondary | ICD-10-CM | POA: Diagnosis not present

## 2023-05-18 DIAGNOSIS — M109 Gout, unspecified: Secondary | ICD-10-CM | POA: Diagnosis not present

## 2023-05-18 DIAGNOSIS — Z882 Allergy status to sulfonamides status: Secondary | ICD-10-CM | POA: Diagnosis not present

## 2023-05-18 DIAGNOSIS — Z5986 Financial insecurity: Secondary | ICD-10-CM | POA: Diagnosis not present

## 2023-05-18 DIAGNOSIS — I130 Hypertensive heart and chronic kidney disease with heart failure and stage 1 through stage 4 chronic kidney disease, or unspecified chronic kidney disease: Secondary | ICD-10-CM | POA: Diagnosis not present

## 2023-05-24 DIAGNOSIS — L2989 Other pruritus: Secondary | ICD-10-CM | POA: Diagnosis not present

## 2023-05-24 DIAGNOSIS — I872 Venous insufficiency (chronic) (peripheral): Secondary | ICD-10-CM | POA: Diagnosis not present

## 2023-05-24 DIAGNOSIS — L84 Corns and callosities: Secondary | ICD-10-CM | POA: Diagnosis not present

## 2023-05-27 ENCOUNTER — Other Ambulatory Visit: Payer: Self-pay | Admitting: Internal Medicine

## 2023-05-28 ENCOUNTER — Ambulatory Visit (INDEPENDENT_AMBULATORY_CARE_PROVIDER_SITE_OTHER): Payer: Medicare PPO | Admitting: Otolaryngology

## 2023-05-28 ENCOUNTER — Encounter (INDEPENDENT_AMBULATORY_CARE_PROVIDER_SITE_OTHER): Payer: Self-pay

## 2023-05-28 VITALS — BP 122/72 | HR 75 | Ht 72.0 in | Wt 198.0 lb

## 2023-05-28 DIAGNOSIS — R0981 Nasal congestion: Secondary | ICD-10-CM

## 2023-05-28 DIAGNOSIS — H903 Sensorineural hearing loss, bilateral: Secondary | ICD-10-CM

## 2023-05-28 DIAGNOSIS — J343 Hypertrophy of nasal turbinates: Secondary | ICD-10-CM

## 2023-05-28 DIAGNOSIS — J342 Deviated nasal septum: Secondary | ICD-10-CM | POA: Diagnosis not present

## 2023-05-28 DIAGNOSIS — J310 Chronic rhinitis: Secondary | ICD-10-CM

## 2023-05-28 DIAGNOSIS — H6123 Impacted cerumen, bilateral: Secondary | ICD-10-CM

## 2023-05-30 NOTE — Progress Notes (Signed)
 Patient ID: [name], male   DOB: 09-11-1934, 89 y.o.   MRN: [id number]  Follow-up: Hearing loss, chronic nasal drainage, nasal congestion, recurrent cerumen impaction   HPI: The patient is an 89 year old male who presents today complaining of clogging sensation in his ears.  He also complains of chronic nasal congestion.  The patient was last seen in October 2024.  At that time, he was noted to have bilateral cerumen impaction, nasal mucosal congestion, nasal septal deviation, and bilateral inferior turbinate hypertrophy.  His hearing test shows bilateral high-frequency sensorineural hearing loss.  The patient returns today complaining of persistent nasal congestion and clogging sensation in his ears.  He denies any recent change in his hearing.  He denies any fever or visual change.  Exam: General: Communicates without difficulty, well nourished, no acute distress. Head: Normocephalic, no evidence injury, no tenderness, facial buttresses intact without stepoff. Face/sinus: No tenderness to palpation and percussion. Facial movement is normal and symmetric. Eyes: PERRL, EOMI. No scleral icterus, conjunctivae clear. Neuro: CN II exam reveals vision grossly intact.  No nystagmus at any point of gaze. Auricles: Intact without lesions. EAC: Bilateral cerumen impaction.  Under the operating microscope, the cerumen is carefully removed with a combination of cerumen currette, alligator forceps, and suction catheters.  After the cerumen is removed, the TMs are noted to be normal. No mass, erythema, or lesions. Nose: External evaluation reveals normal support and skin without lesions.  Dorsum is intact.  Anterior rhinoscopy reveals congested mucosa over anterior aspect of inferior turbinates and deviated septum.  No purulence noted. Oral:  Oral cavity and oropharynx are intact, symmetric, without erythema or edema.  Mucosa is moist without lesions. Neck: Full range of motion without pain.  There is no significant  lymphadenopathy.  No masses palpable.  Thyroid bed within normal limits to palpation.  Parotid glands and submandibular glands equal bilaterally without mass.  Trachea is midline. Neuro:  CN 2-12 grossly intact. Gait normal.    Procedure: Bilateral cerumen disimpaction Anesthesia: None Description: Under the operating microscope, the cerumen is carefully removed with a combination of cerumen currette, alligator forceps, and suction catheters.  After the cerumen is removed, the TMs are noted to be normal.  No mass, erythema, or lesions. The patient tolerated the procedure well.   Assessment: 1.  Bilateral recurrent cerumen impaction.  After the disimpaction procedure, both tympanic membranes and middle ear spaces are noted to be normal. 2.  Chronic rhinitis with nasal mucosal congestion, nasal septal deviation, and bilateral inferior turbinate hypertrophy. 3.  Subjectively stable bilateral high-frequency sensorineural hearing loss.  Plan: 1.  Otomicroscopy with bilateral cerumen disimpaction. 2.  The physical exam findings are reviewed with the patient. 3.  Continue the use of Atrovent nasal spray and nasal saline irrigation. 4.  The patient will return for reevaluation in 4 months.

## 2023-05-31 DIAGNOSIS — N2581 Secondary hyperparathyroidism of renal origin: Secondary | ICD-10-CM | POA: Diagnosis not present

## 2023-05-31 DIAGNOSIS — E1122 Type 2 diabetes mellitus with diabetic chronic kidney disease: Secondary | ICD-10-CM | POA: Diagnosis not present

## 2023-05-31 DIAGNOSIS — E875 Hyperkalemia: Secondary | ICD-10-CM | POA: Diagnosis not present

## 2023-05-31 DIAGNOSIS — I129 Hypertensive chronic kidney disease with stage 1 through stage 4 chronic kidney disease, or unspecified chronic kidney disease: Secondary | ICD-10-CM | POA: Diagnosis not present

## 2023-05-31 DIAGNOSIS — E1129 Type 2 diabetes mellitus with other diabetic kidney complication: Secondary | ICD-10-CM | POA: Diagnosis not present

## 2023-05-31 DIAGNOSIS — D472 Monoclonal gammopathy: Secondary | ICD-10-CM | POA: Diagnosis not present

## 2023-05-31 DIAGNOSIS — N1832 Chronic kidney disease, stage 3b: Secondary | ICD-10-CM | POA: Diagnosis not present

## 2023-06-09 DIAGNOSIS — H4052X3 Glaucoma secondary to other eye disorders, left eye, severe stage: Secondary | ICD-10-CM | POA: Diagnosis not present

## 2023-06-09 DIAGNOSIS — H401112 Primary open-angle glaucoma, right eye, moderate stage: Secondary | ICD-10-CM | POA: Diagnosis not present

## 2023-06-21 DIAGNOSIS — L602 Onychogryphosis: Secondary | ICD-10-CM | POA: Diagnosis not present

## 2023-06-21 DIAGNOSIS — L84 Corns and callosities: Secondary | ICD-10-CM | POA: Diagnosis not present

## 2023-06-21 DIAGNOSIS — M150 Primary generalized (osteo)arthritis: Secondary | ICD-10-CM | POA: Diagnosis not present

## 2023-06-21 DIAGNOSIS — E1122 Type 2 diabetes mellitus with diabetic chronic kidney disease: Secondary | ICD-10-CM | POA: Diagnosis not present

## 2023-06-21 DIAGNOSIS — I70203 Unspecified atherosclerosis of native arteries of extremities, bilateral legs: Secondary | ICD-10-CM | POA: Diagnosis not present

## 2023-06-21 DIAGNOSIS — B353 Tinea pedis: Secondary | ICD-10-CM | POA: Diagnosis not present

## 2023-06-21 DIAGNOSIS — E782 Mixed hyperlipidemia: Secondary | ICD-10-CM | POA: Diagnosis not present

## 2023-06-21 DIAGNOSIS — I10 Essential (primary) hypertension: Secondary | ICD-10-CM | POA: Diagnosis not present

## 2023-06-21 DIAGNOSIS — M792 Neuralgia and neuritis, unspecified: Secondary | ICD-10-CM | POA: Diagnosis not present

## 2023-06-21 DIAGNOSIS — L603 Nail dystrophy: Secondary | ICD-10-CM | POA: Diagnosis not present

## 2023-06-21 DIAGNOSIS — B351 Tinea unguium: Secondary | ICD-10-CM | POA: Diagnosis not present

## 2023-06-21 DIAGNOSIS — E1151 Type 2 diabetes mellitus with diabetic peripheral angiopathy without gangrene: Secondary | ICD-10-CM | POA: Diagnosis not present

## 2023-07-05 ENCOUNTER — Inpatient Hospital Stay: Payer: Medicare PPO | Attending: Hematology

## 2023-07-05 DIAGNOSIS — D472 Monoclonal gammopathy: Secondary | ICD-10-CM | POA: Diagnosis not present

## 2023-07-05 DIAGNOSIS — N1832 Chronic kidney disease, stage 3b: Secondary | ICD-10-CM | POA: Diagnosis not present

## 2023-07-05 DIAGNOSIS — D631 Anemia in chronic kidney disease: Secondary | ICD-10-CM | POA: Diagnosis not present

## 2023-07-05 DIAGNOSIS — D509 Iron deficiency anemia, unspecified: Secondary | ICD-10-CM

## 2023-07-05 LAB — CBC WITH DIFFERENTIAL/PLATELET
Abs Immature Granulocytes: 0.01 10*3/uL (ref 0.00–0.07)
Basophils Absolute: 0 10*3/uL (ref 0.0–0.1)
Basophils Relative: 0 %
Eosinophils Absolute: 0.1 10*3/uL (ref 0.0–0.5)
Eosinophils Relative: 1 %
HCT: 41.8 % (ref 39.0–52.0)
Hemoglobin: 13.4 g/dL (ref 13.0–17.0)
Immature Granulocytes: 0 %
Lymphocytes Relative: 31 %
Lymphs Abs: 1.6 10*3/uL (ref 0.7–4.0)
MCH: 29.8 pg (ref 26.0–34.0)
MCHC: 32.1 g/dL (ref 30.0–36.0)
MCV: 92.9 fL (ref 80.0–100.0)
Monocytes Absolute: 0.3 10*3/uL (ref 0.1–1.0)
Monocytes Relative: 6 %
Neutro Abs: 3.2 10*3/uL (ref 1.7–7.7)
Neutrophils Relative %: 62 %
Platelets: 158 10*3/uL (ref 150–400)
RBC: 4.5 MIL/uL (ref 4.22–5.81)
RDW: 15.6 % — ABNORMAL HIGH (ref 11.5–15.5)
WBC: 5.2 10*3/uL (ref 4.0–10.5)
nRBC: 0 % (ref 0.0–0.2)

## 2023-07-05 LAB — COMPREHENSIVE METABOLIC PANEL
ALT: 23 U/L (ref 0–44)
AST: 23 U/L (ref 15–41)
Albumin: 3.8 g/dL (ref 3.5–5.0)
Alkaline Phosphatase: 74 U/L (ref 38–126)
Anion gap: 9 (ref 5–15)
BUN: 32 mg/dL — ABNORMAL HIGH (ref 8–23)
CO2: 24 mmol/L (ref 22–32)
Calcium: 9.3 mg/dL (ref 8.9–10.3)
Chloride: 106 mmol/L (ref 98–111)
Creatinine, Ser: 2.22 mg/dL — ABNORMAL HIGH (ref 0.61–1.24)
GFR, Estimated: 28 mL/min — ABNORMAL LOW (ref >60)
Glucose, Bld: 180 mg/dL — ABNORMAL HIGH (ref 70–99)
Potassium: 4.2 mmol/L (ref 3.5–5.1)
Sodium: 139 mmol/L (ref 135–145)
Total Bilirubin: 0.7 mg/dL (ref 0.0–1.2)
Total Protein: 8 g/dL (ref 6.5–8.1)

## 2023-07-05 LAB — IRON AND TIBC
Iron: 89 ug/dL (ref 45–182)
Saturation Ratios: 30 % (ref 17.9–39.5)
TIBC: 294 ug/dL (ref 250–450)
UIBC: 205 ug/dL

## 2023-07-05 LAB — LACTATE DEHYDROGENASE: LDH: 148 U/L (ref 98–192)

## 2023-07-05 LAB — FERRITIN: Ferritin: 147 ng/mL (ref 24–336)

## 2023-07-06 LAB — KAPPA/LAMBDA LIGHT CHAINS
Kappa free light chain: 103.9 mg/L — ABNORMAL HIGH (ref 3.3–19.4)
Kappa, lambda light chain ratio: 5.38 — ABNORMAL HIGH (ref 0.26–1.65)
Lambda free light chains: 19.3 mg/L (ref 5.7–26.3)

## 2023-07-06 LAB — PROTEIN ELECTROPHORESIS, SERUM
A/G Ratio: 1 (ref 0.7–1.7)
Albumin ELP: 3.6 g/dL (ref 2.9–4.4)
Alpha-1-Globulin: 0.2 g/dL (ref 0.0–0.4)
Alpha-2-Globulin: 0.9 g/dL (ref 0.4–1.0)
Beta Globulin: 0.8 g/dL (ref 0.7–1.3)
Gamma Globulin: 1.6 g/dL (ref 0.4–1.8)
Globulin, Total: 3.6 g/dL (ref 2.2–3.9)
M-Spike, %: 1 g/dL — ABNORMAL HIGH
Total Protein ELP: 7.2 g/dL (ref 6.0–8.5)
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
## 2023-07-12 ENCOUNTER — Inpatient Hospital Stay: Payer: Medicare PPO | Admitting: Physician Assistant

## 2023-07-12 DIAGNOSIS — D472 Monoclonal gammopathy: Secondary | ICD-10-CM

## 2023-07-12 DIAGNOSIS — N1832 Chronic kidney disease, stage 3b: Secondary | ICD-10-CM

## 2023-07-12 DIAGNOSIS — D631 Anemia in chronic kidney disease: Secondary | ICD-10-CM

## 2023-07-12 DIAGNOSIS — D509 Iron deficiency anemia, unspecified: Secondary | ICD-10-CM

## 2023-07-12 NOTE — Patient Instructions (Signed)
 [facility] at [facility] Discharge Instructions  You were seen today by Rojelio Brenner PA-C for your iron deficiency anemia and your abnormal protein (MGUS).  Your blood and protein levels remain stable.  You do not have any sign of multiple myeloma cancer at this time.  We will check these labs again at your follow-up visit in 6 months.  You will also need whole-body X-RAYS in 6 months.  Your blood and iron levels look great!  You do not need any IV iron  LABS: Return in 6 months for repeat labs  FOLLOW-UP APPOINTMENT: Office visit in 6 months   - - - - - - - - - - - - - - - - - -    Thank you for choosing [facility] at [facility] to provide your oncology and hematology care.  To afford each patient quality time with our provider, please arrive at least 15 minutes before your scheduled appointment time.   If you have a lab appointment with the Cancer Center please come in thru the Main Entrance and check in at the main information desk.  You need to re-schedule your appointment should you arrive 10 or more minutes late.  We strive to give you quality time with our providers, and arriving late affects you and other patients whose appointments are after yours.  Also, if you no show three or more times for appointments you may be dismissed from the clinic at the providers discretion.     Again, thank you for choosing [facility].  Our hope is that these requests will decrease the amount of time that you wait before being seen by our physicians.       _____________________________________________________________  Should you have questions after your visit to [facility], please contact our office at [phone] and follow the prompts.  Our office hours are 8:00 a.m. and 4:30 p.m. Monday - Friday.  Please note that voicemails left after 4:00 p.m. may not be returned until the following business day.  We are closed  weekends and major holidays.  You do have access to a nurse 24-7, just call the main number to the clinic [phone] and do not press any options, hold on the line and a nurse will answer the phone.    For prescription refill requests, have your pharmacy contact our office and allow 72 hours.    Due to Covid, you will need to wear a mask upon entering the hospital. If you do not have a mask, a mask will be given to you at the Main Entrance upon arrival. For doctor visits, patients may have 1 support person age 11 or older with them. For treatment visits, patients can not have anyone with them due to social distancing guidelines and our immunocompromised population.

## 2023-07-30 ENCOUNTER — Ambulatory Visit (INDEPENDENT_AMBULATORY_CARE_PROVIDER_SITE_OTHER): Payer: Medicare PPO

## 2023-08-11 ENCOUNTER — Encounter: Payer: Self-pay | Admitting: Diagnostic Neuroimaging

## 2023-08-11 ENCOUNTER — Ambulatory Visit: Payer: Self-pay | Admitting: Diagnostic Neuroimaging

## 2023-08-11 VITALS — BP 110/64 | HR 68 | Ht 72.0 in | Wt 196.8 lb

## 2023-08-11 DIAGNOSIS — R200 Anesthesia of skin: Secondary | ICD-10-CM
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
## 2023-08-11 NOTE — Patient Instructions (Addendum)
 MILD BILATERAL FINGERTIP NUMBNESS (since ~2024) - suspect very mild carpal tunnel syndrome (R > L) - recommend to wear bilateral carpal tunnel wrist splints at bedtime

## 2023-08-30 DIAGNOSIS — E1122 Type 2 diabetes mellitus with diabetic chronic kidney disease: Secondary | ICD-10-CM | POA: Diagnosis not present

## 2023-08-30 DIAGNOSIS — N184 Chronic kidney disease, stage 4 (severe): Secondary | ICD-10-CM | POA: Diagnosis not present

## 2023-08-30 DIAGNOSIS — E1165 Type 2 diabetes mellitus with hyperglycemia: Secondary | ICD-10-CM | POA: Diagnosis not present

## 2023-08-30 DIAGNOSIS — G4733 Obstructive sleep apnea (adult) (pediatric): Secondary | ICD-10-CM | POA: Diagnosis not present

## 2023-08-30 DIAGNOSIS — R202 Paresthesia of skin: Secondary | ICD-10-CM | POA: Diagnosis not present

## 2023-08-30 DIAGNOSIS — F0150 Vascular dementia without behavioral disturbance: Secondary | ICD-10-CM | POA: Diagnosis not present

## 2023-08-30 DIAGNOSIS — K4090 Unilateral inguinal hernia, without obstruction or gangrene, not specified as recurrent: Secondary | ICD-10-CM | POA: Diagnosis not present

## 2023-08-30 DIAGNOSIS — I119 Hypertensive heart disease without heart failure: Secondary | ICD-10-CM | POA: Diagnosis not present

## 2023-08-30 DIAGNOSIS — K861 Other chronic pancreatitis: Secondary | ICD-10-CM | POA: Diagnosis not present

## 2023-08-30 DIAGNOSIS — J300 Vasomotor rhinitis: Secondary | ICD-10-CM | POA: Diagnosis not present

## 2023-08-30 DIAGNOSIS — G5601 Carpal tunnel syndrome, right upper limb: Secondary | ICD-10-CM | POA: Diagnosis not present

## 2023-08-30 DIAGNOSIS — H900 Conductive hearing loss, bilateral: Secondary | ICD-10-CM | POA: Diagnosis not present

## 2023-08-30 DIAGNOSIS — J4520 Mild intermittent asthma, uncomplicated: Secondary | ICD-10-CM | POA: Diagnosis not present

## 2023-09-02 ENCOUNTER — Other Ambulatory Visit (HOSPITAL_COMMUNITY): Payer: Self-pay | Admitting: Internal Medicine

## 2023-09-02 ENCOUNTER — Telehealth (HOSPITAL_COMMUNITY): Payer: Self-pay | Admitting: *Deleted

## 2023-09-02 DIAGNOSIS — R1310 Dysphagia, unspecified: Secondary | ICD-10-CM

## 2023-09-02 DIAGNOSIS — R059 Cough, unspecified: Secondary | ICD-10-CM

## 2023-09-02 NOTE — Telephone Encounter (Signed)
 Attempted to contact patient to schedule OP MBS. Left VM @ [phone]. RKEEL

## 2023-09-15 ENCOUNTER — Ambulatory Visit (HOSPITAL_COMMUNITY)
Admission: RE | Admit: 2023-09-15 | Discharge: 2023-09-15 | Disposition: A | Discharge status: Home / Self Care | Source: Ambulatory Visit | Attending: Internal Medicine | Admitting: Internal Medicine

## 2023-09-15 DIAGNOSIS — R053 Chronic cough: Secondary | ICD-10-CM | POA: Diagnosis not present

## 2023-09-15 DIAGNOSIS — R059 Cough, unspecified: Secondary | ICD-10-CM

## 2023-09-15 DIAGNOSIS — R1310 Dysphagia, unspecified: Secondary | ICD-10-CM

## 2023-09-15 DIAGNOSIS — R933 Abnormal findings on diagnostic imaging of other parts of digestive tract: Secondary | ICD-10-CM | POA: Insufficient documentation

## 2023-09-15 DIAGNOSIS — R1313 Dysphagia, pharyngeal phase: Secondary | ICD-10-CM | POA: Diagnosis not present
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
## 2023-10-01 ENCOUNTER — Ambulatory Visit (INDEPENDENT_AMBULATORY_CARE_PROVIDER_SITE_OTHER): Payer: Medicare PPO | Admitting: Otolaryngology

## 2023-10-01 ENCOUNTER — Encounter (INDEPENDENT_AMBULATORY_CARE_PROVIDER_SITE_OTHER): Payer: Self-pay | Admitting: Otolaryngology

## 2023-10-01 VITALS — BP 105/69 | HR 71 | Ht 72.0 in | Wt 190.0 lb

## 2023-10-01 DIAGNOSIS — J343 Hypertrophy of nasal turbinates: Secondary | ICD-10-CM

## 2023-10-01 DIAGNOSIS — H903 Sensorineural hearing loss, bilateral: Secondary | ICD-10-CM

## 2023-10-01 DIAGNOSIS — R0982 Postnasal drip: Secondary | ICD-10-CM

## 2023-10-01 DIAGNOSIS — J342 Deviated nasal septum: Secondary | ICD-10-CM

## 2023-10-01 DIAGNOSIS — J310 Chronic rhinitis: Secondary | ICD-10-CM | POA: Diagnosis not present

## 2023-10-01 DIAGNOSIS — R0981 Nasal congestion: Secondary | ICD-10-CM

## 2023-10-01 DIAGNOSIS — H6123 Impacted cerumen, bilateral: Secondary | ICD-10-CM

## 2023-10-03 DIAGNOSIS — R0982 Postnasal drip: Secondary | ICD-10-CM | POA: Insufficient documentation

## 2023-10-03 NOTE — Progress Notes (Signed)
 Patient ID: [name], male   DOB: 01/16/1935, 89 y.o.   MRN: [id number]  Follow-up: Chronic nasal drainage, nasal congestion, recurrent cerumen impaction, hearing loss  HPI: The patient is an 89 year old male who returns today for his follow-up evaluation.  He was last seen in February 2025.  At that time, he was complaining of chronic nasal drainage, nasal congestion, bilateral sensorineural hearing loss, and recurrent cerumen impaction.  He was noted to have nasal septal deviation and bilateral inferior turbinate hypertrophy.  He was treated with Atrovent and nasal saline irrigation.  The patient returns today complaining of occasional nasal congestion and postnasal drainage.  His symptoms have improved with the medications.  He currently wears bilateral hearing aids.  He denies any recent change in his hearing.  Exam: General: Communicates without difficulty, well nourished, no acute distress. Head: Normocephalic, no evidence injury, no tenderness, facial buttresses intact without stepoff. Face/sinus: No tenderness to palpation and percussion. Facial movement is normal and symmetric. Eyes: PERRL, EOMI. No scleral icterus, conjunctivae clear. Neuro: CN II exam reveals vision grossly intact.  No nystagmus at any point of gaze. Auricles: Intact without lesions. EAC: Bilateral cerumen impaction.  Nose: External evaluation reveals normal support and skin without lesions.  Dorsum is intact.  Anterior rhinoscopy reveals congested mucosa over anterior aspect of inferior turbinates and deviated septum.  No purulence noted. Oral:  Oral cavity and oropharynx are intact, symmetric, without erythema or edema.  Mucosa is moist without lesions. Neck: Full range of motion without pain.  There is no significant lymphadenopathy.  No masses palpable.  Thyroid bed within normal limits to palpation.  Parotid glands and submandibular glands equal bilaterally without mass.  Trachea is midline. Neuro:  CN 2-12 grossly intact.  Gait normal.    Procedure: Bilateral cerumen disimpaction Anesthesia: None Description: Under the operating microscope, the cerumen is carefully removed with a combination of cerumen currette, alligator forceps, and suction catheters.  After the cerumen is removed, the TMs are noted to be normal.  No mass, erythema, or lesions. The patient tolerated the procedure well.    Assessment: 1.  Bilateral cerumen impaction.  After the cerumen disimpaction procedure, both tympanic membranes and middle ear spaces are normal. 2.  Chronic rhinitis with nasal mucosal congestion, nasal septal deviation, bilateral inferior turbinate hypertrophy, and chronic postnasal drainage. 3.  Subjectively stable bilateral sensorineural hearing loss.  Plan: 1.  Otomicroscopy with bilateral cerumen disimpaction. 2.  The physical exam findings are reviewed with the patient. 3.  Continue with nasal saline irrigation and Atrovent nasal spray. 4.  The patient will return for reevaluation in 3 months.

## 2023-10-05 DIAGNOSIS — E1122 Type 2 diabetes mellitus with diabetic chronic kidney disease: Secondary | ICD-10-CM | POA: Diagnosis not present

## 2023-10-05 DIAGNOSIS — F0150 Vascular dementia without behavioral disturbance: Secondary | ICD-10-CM | POA: Diagnosis not present

## 2023-10-05 DIAGNOSIS — G4733 Obstructive sleep apnea (adult) (pediatric): Secondary | ICD-10-CM | POA: Diagnosis not present

## 2023-10-05 DIAGNOSIS — L84 Corns and callosities: Secondary | ICD-10-CM | POA: Diagnosis not present

## 2023-10-05 DIAGNOSIS — N184 Chronic kidney disease, stage 4 (severe): Secondary | ICD-10-CM | POA: Diagnosis not present

## 2023-10-05 DIAGNOSIS — I70203 Unspecified atherosclerosis of native arteries of extremities, bilateral legs: Secondary | ICD-10-CM | POA: Diagnosis not present

## 2023-10-05 DIAGNOSIS — E1151 Type 2 diabetes mellitus with diabetic peripheral angiopathy without gangrene: Secondary | ICD-10-CM | POA: Diagnosis not present

## 2023-10-05 DIAGNOSIS — H900 Conductive hearing loss, bilateral: Secondary | ICD-10-CM | POA: Diagnosis not present

## 2023-10-05 DIAGNOSIS — L602 Onychogryphosis: Secondary | ICD-10-CM | POA: Diagnosis not present

## 2023-10-05 DIAGNOSIS — J4520 Mild intermittent asthma, uncomplicated: Secondary | ICD-10-CM | POA: Diagnosis not present

## 2023-10-05 DIAGNOSIS — B353 Tinea pedis: Secondary | ICD-10-CM | POA: Diagnosis not present

## 2023-10-05 DIAGNOSIS — M792 Neuralgia and neuritis, unspecified: Secondary | ICD-10-CM | POA: Diagnosis not present

## 2023-10-05 DIAGNOSIS — L603 Nail dystrophy: Secondary | ICD-10-CM | POA: Diagnosis not present

## 2023-10-05 DIAGNOSIS — J300 Vasomotor rhinitis: Secondary | ICD-10-CM | POA: Diagnosis not present

## 2023-10-05 DIAGNOSIS — I119 Hypertensive heart disease without heart failure: Secondary | ICD-10-CM | POA: Diagnosis not present

## 2023-10-05 DIAGNOSIS — R202 Paresthesia of skin: Secondary | ICD-10-CM | POA: Diagnosis not present

## 2023-10-05 DIAGNOSIS — B351 Tinea unguium: Secondary | ICD-10-CM | POA: Diagnosis not present

## 2023-10-19 ENCOUNTER — Telehealth (INDEPENDENT_AMBULATORY_CARE_PROVIDER_SITE_OTHER): Payer: Self-pay | Admitting: Otolaryngology

## 2023-10-19 NOTE — Telephone Encounter (Signed)
 Patient came into the office and requested a refill be called into the [facility] on [address] in [place], [phone].  The Rx is for Ipratropium Bromide nasal spray.  He stated that the pharmacy told him the Provider refused to approve refills.  Patient wanted to know why.  He can be reached at [phone].

## 2023-10-20 ENCOUNTER — Telehealth (INDEPENDENT_AMBULATORY_CARE_PROVIDER_SITE_OTHER): Payer: Self-pay | Admitting: Otolaryngology

## 2023-10-20 NOTE — Telephone Encounter (Signed)
 Atrovent ( ipratropium bromide) nasal spray was called in with 6 total refill at [facility] on [address] In [place].

## 2023-11-08 DIAGNOSIS — M109 Gout, unspecified: Secondary | ICD-10-CM | POA: Diagnosis not present

## 2023-11-15 DIAGNOSIS — M25531 Pain in right wrist: Secondary | ICD-10-CM | POA: Diagnosis not present

## 2023-11-16 DIAGNOSIS — R351 Nocturia: Secondary | ICD-10-CM | POA: Diagnosis not present

## 2023-11-16 DIAGNOSIS — N5201 Erectile dysfunction due to arterial insufficiency: Secondary | ICD-10-CM | POA: Diagnosis not present

## 2023-11-22 ENCOUNTER — Other Ambulatory Visit: Payer: Self-pay | Admitting: *Deleted

## 2023-11-29 DIAGNOSIS — N2581 Secondary hyperparathyroidism of renal origin: Secondary | ICD-10-CM | POA: Diagnosis not present

## 2023-11-29 DIAGNOSIS — I129 Hypertensive chronic kidney disease with stage 1 through stage 4 chronic kidney disease, or unspecified chronic kidney disease: Secondary | ICD-10-CM | POA: Diagnosis not present

## 2023-11-29 DIAGNOSIS — E1122 Type 2 diabetes mellitus with diabetic chronic kidney disease: Secondary | ICD-10-CM | POA: Diagnosis not present

## 2023-11-29 DIAGNOSIS — N1832 Chronic kidney disease, stage 3b: Secondary | ICD-10-CM | POA: Diagnosis not present

## 2023-11-29 DIAGNOSIS — E875 Hyperkalemia: Secondary | ICD-10-CM | POA: Diagnosis not present

## 2023-11-29 DIAGNOSIS — E1129 Type 2 diabetes mellitus with other diabetic kidney complication: Secondary | ICD-10-CM | POA: Diagnosis not present

## 2023-11-29 DIAGNOSIS — D472 Monoclonal gammopathy: Secondary | ICD-10-CM | POA: Diagnosis not present

## 2023-12-14 DIAGNOSIS — G629 Polyneuropathy, unspecified: Secondary | ICD-10-CM | POA: Diagnosis not present

## 2023-12-14 DIAGNOSIS — G5620 Lesion of ulnar nerve, unspecified upper limb: Secondary | ICD-10-CM | POA: Diagnosis not present

## 2023-12-16 DIAGNOSIS — J300 Vasomotor rhinitis: Secondary | ICD-10-CM | POA: Diagnosis not present

## 2023-12-16 DIAGNOSIS — G4733 Obstructive sleep apnea (adult) (pediatric): Secondary | ICD-10-CM | POA: Diagnosis not present

## 2023-12-16 DIAGNOSIS — K4090 Unilateral inguinal hernia, without obstruction or gangrene, not specified as recurrent: Secondary | ICD-10-CM | POA: Diagnosis not present

## 2023-12-16 DIAGNOSIS — R141 Gas pain: Secondary | ICD-10-CM | POA: Diagnosis not present

## 2023-12-16 DIAGNOSIS — R202 Paresthesia of skin: Secondary | ICD-10-CM | POA: Diagnosis not present

## 2023-12-16 DIAGNOSIS — Z23 Encounter for immunization: Secondary | ICD-10-CM | POA: Diagnosis not present

## 2023-12-16 DIAGNOSIS — N184 Chronic kidney disease, stage 4 (severe): Secondary | ICD-10-CM | POA: Diagnosis not present

## 2023-12-16 DIAGNOSIS — J4520 Mild intermittent asthma, uncomplicated: Secondary | ICD-10-CM | POA: Diagnosis not present

## 2023-12-16 DIAGNOSIS — E1122 Type 2 diabetes mellitus with diabetic chronic kidney disease: Secondary | ICD-10-CM | POA: Diagnosis not present

## 2023-12-16 DIAGNOSIS — I119 Hypertensive heart disease without heart failure: Secondary | ICD-10-CM | POA: Diagnosis not present

## 2023-12-16 DIAGNOSIS — F0150 Vascular dementia without behavioral disturbance: Secondary | ICD-10-CM | POA: Diagnosis not present

## 2023-12-22 DIAGNOSIS — G5601 Carpal tunnel syndrome, right upper limb: Secondary | ICD-10-CM | POA: Diagnosis not present

## 2024-01-03 ENCOUNTER — Encounter (INDEPENDENT_AMBULATORY_CARE_PROVIDER_SITE_OTHER): Payer: Self-pay | Admitting: Otolaryngology

## 2024-01-03 ENCOUNTER — Ambulatory Visit (INDEPENDENT_AMBULATORY_CARE_PROVIDER_SITE_OTHER): Admitting: Otolaryngology

## 2024-01-03 VITALS — BP 133/72 | HR 77 | Temp 98.6°F | Ht 72.0 in | Wt 190.0 lb

## 2024-01-03 DIAGNOSIS — J310 Chronic rhinitis: Secondary | ICD-10-CM

## 2024-01-03 DIAGNOSIS — H6123 Impacted cerumen, bilateral: Secondary | ICD-10-CM

## 2024-01-03 DIAGNOSIS — R0982 Postnasal drip: Secondary | ICD-10-CM

## 2024-01-03 DIAGNOSIS — J343 Hypertrophy of nasal turbinates: Secondary | ICD-10-CM

## 2024-01-03 DIAGNOSIS — J342 Deviated nasal septum: Secondary | ICD-10-CM | POA: Diagnosis not present

## 2024-01-03 DIAGNOSIS — R0981 Nasal congestion: Secondary | ICD-10-CM

## 2024-01-04 ENCOUNTER — Inpatient Hospital Stay: Attending: Physician Assistant

## 2024-01-04 ENCOUNTER — Ambulatory Visit (HOSPITAL_COMMUNITY)
Admission: RE | Admit: 2024-01-04 | Discharge: 2024-01-04 | Disposition: A | Discharge status: Home / Self Care | Source: Ambulatory Visit | Attending: Physician Assistant | Admitting: Physician Assistant

## 2024-01-04 DIAGNOSIS — M898X8 Other specified disorders of bone, other site: Secondary | ICD-10-CM | POA: Diagnosis not present

## 2024-01-04 DIAGNOSIS — D472 Monoclonal gammopathy: Secondary | ICD-10-CM | POA: Insufficient documentation

## 2024-01-04 DIAGNOSIS — D509 Iron deficiency anemia, unspecified: Secondary | ICD-10-CM | POA: Diagnosis not present

## 2024-01-04 DIAGNOSIS — N183 Chronic kidney disease, stage 3 unspecified: Secondary | ICD-10-CM | POA: Diagnosis not present

## 2024-01-04 DIAGNOSIS — E1142 Type 2 diabetes mellitus with diabetic polyneuropathy: Secondary | ICD-10-CM | POA: Insufficient documentation

## 2024-01-04 DIAGNOSIS — N1832 Chronic kidney disease, stage 3b: Secondary | ICD-10-CM

## 2024-01-04 LAB — CBC WITH DIFFERENTIAL/PLATELET
Abs Immature Granulocytes: 0 K/uL (ref 0.00–0.07)
Basophils Absolute: 0 K/uL (ref 0.0–0.1)
Basophils Relative: 0 %
Eosinophils Absolute: 0.1 K/uL (ref 0.0–0.5)
Eosinophils Relative: 2 %
HCT: 44 % (ref 39.0–52.0)
Hemoglobin: 14.3 g/dL (ref 13.0–17.0)
Immature Granulocytes: 0 %
Lymphocytes Relative: 25 %
Lymphs Abs: 1.2 K/uL (ref 0.7–4.0)
MCH: 30.2 pg (ref 26.0–34.0)
MCHC: 32.5 g/dL (ref 30.0–36.0)
MCV: 93 fL (ref 80.0–100.0)
Monocytes Absolute: 0.3 K/uL (ref 0.1–1.0)
Monocytes Relative: 7 %
Neutro Abs: 3 K/uL (ref 1.7–7.7)
Neutrophils Relative %: 66 %
Platelets: 198 K/uL (ref 150–400)
RBC: 4.73 MIL/uL (ref 4.22–5.81)
RDW: 14.8 % (ref 11.5–15.5)
WBC: 4.6 K/uL (ref 4.0–10.5)
nRBC: 0 % (ref 0.0–0.2)

## 2024-01-04 LAB — IRON AND TIBC
Iron: 92 ug/dL (ref 45–182)
Saturation Ratios: 33 % (ref 17.9–39.5)
TIBC: 282 ug/dL (ref 250–450)
UIBC: 190 ug/dL

## 2024-01-04 LAB — COMPREHENSIVE METABOLIC PANEL WITH GFR
ALT: 24 U/L (ref 0–44)
AST: 25 U/L (ref 15–41)
Albumin: 3.7 g/dL (ref 3.5–5.0)
Alkaline Phosphatase: 78 U/L (ref 38–126)
Anion gap: 13 (ref 5–15)
BUN: 28 mg/dL — ABNORMAL HIGH (ref 8–23)
CO2: 23 mmol/L (ref 22–32)
Calcium: 9.2 mg/dL (ref 8.9–10.3)
Chloride: 103 mmol/L (ref 98–111)
Creatinine, Ser: 2.21 mg/dL — ABNORMAL HIGH (ref 0.61–1.24)
GFR, Estimated: 28 mL/min — ABNORMAL LOW (ref >60)
Glucose, Bld: 118 mg/dL — ABNORMAL HIGH (ref 70–99)
Potassium: 4.4 mmol/L (ref 3.5–5.1)
Sodium: 139 mmol/L (ref 135–145)
Total Bilirubin: 0.9 mg/dL (ref 0.0–1.2)
Total Protein: 8.1 g/dL (ref 6.5–8.1)

## 2024-01-04 LAB — LACTATE DEHYDROGENASE: LDH: 144 U/L (ref 98–192)

## 2024-01-04 LAB — FERRITIN: Ferritin: 193 ng/mL (ref 24–336)

## 2024-01-04 NOTE — Progress Notes (Signed)
 Patient ID: [name], male   DOB: 10/20/1934, 89 y.o.   MRN: [id number]  Follow-up: Chronic nasal congestion, chronic nasal drainage  HPI: The patient is an 89 year old male who returns today for his follow-up evaluation.  The patient has a history of chronic nasal congestion and nasal drainage.  He was noted to have nasal septal deviation and bilateral inferior turbinate hypertrophy.  He was treated with Atrovent and nasal saline irrigation.  The patient returns today reporting intermittent nasal drainage.  His nasal congestion has improved.  Currently he denies any facial pain, fever, or visual change.  Exam: General: Communicates without difficulty, well nourished, no acute distress. Head: Normocephalic, no evidence injury, no tenderness, facial buttresses intact without stepoff. Face/sinus: No tenderness to palpation and percussion. Facial movement is normal and symmetric. Eyes: PERRL, EOMI. No scleral icterus, conjunctivae clear. Neuro: CN II exam reveals vision grossly intact.  No nystagmus at any point of gaze. Auricles: Intact without lesions. EAC: Bilateral cerumen impaction.  Nose: External evaluation reveals normal support and skin without lesions.  Dorsum is intact.  Anterior rhinoscopy reveals congested mucosa over anterior aspect of inferior turbinates and deviated septum.  No purulence noted. Oral:  Oral cavity and oropharynx are intact, symmetric, without erythema or edema.  Mucosa is moist without lesions. Neck: Full range of motion without pain.  There is no significant lymphadenopathy.  No masses palpable.  Thyroid bed within normal limits to palpation.  Parotid glands and submandibular glands equal bilaterally without mass.  Trachea is midline. Neuro:  CN 2-12 grossly intact. Gait normal.    Procedure: Bilateral cerumen disimpaction Anesthesia: None Description: Under the operating microscope, the cerumen is carefully removed with a combination of cerumen currette, alligator forceps,  and suction catheters.  After the cerumen is removed, the TMs are noted to be normal.  No mass, erythema, or lesions. The patient tolerated the procedure well.    Assessment: 1.  Bilateral recurrent cerumen impaction.  After the cerumen disimpaction procedure, both tympanic membranes and middle ear spaces are normal. 2.  Chronic rhinitis with nasal mucosal congestion, nasal septal deviation, bilateral inferior turbinate hypertrophy, and chronic postnasal drainage.  His symptoms are currently under control with Atrovent and nasal saline irrigation.   Plan: 1.  Otomicroscopy with bilateral cerumen disimpaction. 2.  The physical exam findings are reviewed with the patient. 3.  Continue with nasal saline irrigation and Atrovent nasal spray. 4.  The patient will return for reevaluation in 6 months.

## 2024-01-05 DIAGNOSIS — B353 Tinea pedis: Secondary | ICD-10-CM | POA: Diagnosis not present

## 2024-01-05 DIAGNOSIS — L603 Nail dystrophy: Secondary | ICD-10-CM | POA: Diagnosis not present

## 2024-01-05 DIAGNOSIS — L602 Onychogryphosis: Secondary | ICD-10-CM | POA: Diagnosis not present

## 2024-01-05 DIAGNOSIS — B351 Tinea unguium: Secondary | ICD-10-CM | POA: Diagnosis not present

## 2024-01-05 DIAGNOSIS — I70203 Unspecified atherosclerosis of native arteries of extremities, bilateral legs: Secondary | ICD-10-CM | POA: Diagnosis not present

## 2024-01-05 DIAGNOSIS — E1151 Type 2 diabetes mellitus with diabetic peripheral angiopathy without gangrene: Secondary | ICD-10-CM | POA: Diagnosis not present

## 2024-01-05 DIAGNOSIS — M792 Neuralgia and neuritis, unspecified: Secondary | ICD-10-CM | POA: Diagnosis not present

## 2024-01-05 DIAGNOSIS — L84 Corns and callosities: Secondary | ICD-10-CM | POA: Diagnosis not present

## 2024-01-05 LAB — KAPPA/LAMBDA LIGHT CHAINS
Kappa free light chain: 115.7 mg/L — ABNORMAL HIGH (ref 3.3–19.4)
Kappa, lambda light chain ratio: 5.81 — ABNORMAL HIGH (ref 0.26–1.65)
Lambda free light chains: 19.9 mg/L (ref 5.7–26.3)

## 2024-01-06 DIAGNOSIS — H4052X3 Glaucoma secondary to other eye disorders, left eye, severe stage: Secondary | ICD-10-CM | POA: Diagnosis not present

## 2024-01-06 DIAGNOSIS — H401112 Primary open-angle glaucoma, right eye, moderate stage: Secondary | ICD-10-CM | POA: Diagnosis not present

## 2024-01-06 LAB — PROTEIN ELECTROPHORESIS, SERUM
A/G Ratio: 0.9 (ref 0.7–1.7)
Albumin ELP: 3.5 g/dL (ref 2.9–4.4)
Alpha-1-Globulin: 0.3 g/dL (ref 0.0–0.4)
Alpha-2-Globulin: 1.2 g/dL — ABNORMAL HIGH (ref 0.4–1.0)
Beta Globulin: 0.9 g/dL (ref 0.7–1.3)
Gamma Globulin: 1.7 g/dL (ref 0.4–1.8)
Globulin, Total: 4 g/dL — ABNORMAL HIGH (ref 2.2–3.9)
M-Spike, %: 0.9 g/dL — ABNORMAL HIGH
Total Protein ELP: 7.5 g/dL (ref 6.0–8.5)
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
## 2024-01-11 ENCOUNTER — Inpatient Hospital Stay: Admitting: Physician Assistant

## 2024-01-11 VITALS — BP 118/71 | HR 68 | Temp 97.0°F | Resp 18 | Ht 72.0 in | Wt 190.0 lb

## 2024-01-11 DIAGNOSIS — D509 Iron deficiency anemia, unspecified: Secondary | ICD-10-CM

## 2024-01-11 DIAGNOSIS — E1142 Type 2 diabetes mellitus with diabetic polyneuropathy: Secondary | ICD-10-CM | POA: Diagnosis not present

## 2024-01-11 DIAGNOSIS — D631 Anemia in chronic kidney disease: Secondary | ICD-10-CM

## 2024-01-11 DIAGNOSIS — D472 Monoclonal gammopathy: Secondary | ICD-10-CM | POA: Diagnosis not present

## 2024-01-11 DIAGNOSIS — N1832 Chronic kidney disease, stage 3b: Secondary | ICD-10-CM

## 2024-01-11 DIAGNOSIS — N183 Chronic kidney disease, stage 3 unspecified: Secondary | ICD-10-CM | POA: Diagnosis not present

## 2024-01-11 NOTE — Patient Instructions (Signed)
[facility] at [facility] Discharge Instructions  You were seen today by Rojelio Brenner PA-C for your iron deficiency anemia and your abnormal protein (MGUS).  Your blood and protein levels remain stable.  You do not have any sign of multiple myeloma cancer at this time.  We will check these labs again at your follow-up visit in 6 months.  Your blood and iron levels look great!  You do not need any IV iron  LABS: Return in 6 months for repeat labs  FOLLOW-UP APPOINTMENT: Office visit in 6 months   - - - - - - - - - - - - - - - - - -    Thank you for choosing [facility] at [facility] to provide your oncology and hematology care.  To afford each patient quality time with our provider, please arrive at least 15 minutes before your scheduled appointment time.   If you have a lab appointment with the Cancer Center please come in thru the Main Entrance and check in at the main information desk.  You need to re-schedule your appointment should you arrive 10 or more minutes late.  We strive to give you quality time with our providers, and arriving late affects you and other patients whose appointments are after yours.  Also, if you no show three or more times for appointments you may be dismissed from the clinic at the providers discretion.     Again, thank you for choosing [facility].  Our hope is that these requests will decrease the amount of time that you wait before being seen by our physicians.       _____________________________________________________________  Should you have questions after your visit to [facility], please contact our office at [phone] and follow the prompts.  Our office hours are 8:00 a.m. and 4:30 p.m. Monday - Friday.  Please note that voicemails left after 4:00 p.m. may not be returned until the following business day.  We are closed weekends and major holidays.  You do have access to a  nurse 24-7, just call the main number to the clinic [phone] and do not press any options, hold on the line and a nurse will answer the phone.    For prescription refill requests, have your pharmacy contact our office and allow 72 hours.    Due to Covid, you will need to wear a mask upon entering the hospital. If you do not have a mask, a mask will be given to you at the Main Entrance upon arrival. For doctor visits, patients may have 1 support person age 82 or older with them. For treatment visits, patients can not have anyone with them due to social distancing guidelines and our immunocompromised population.

## 2024-01-14 DIAGNOSIS — Z23 Encounter for immunization: Secondary | ICD-10-CM | POA: Diagnosis not present

## 2024-03-28 ENCOUNTER — Encounter (INDEPENDENT_AMBULATORY_CARE_PROVIDER_SITE_OTHER): Payer: Self-pay | Admitting: Otolaryngology

## 2024-03-28 ENCOUNTER — Ambulatory Visit (INDEPENDENT_AMBULATORY_CARE_PROVIDER_SITE_OTHER): Admitting: Otolaryngology

## 2024-03-28 VITALS — HR 73 | Temp 97.8°F | Ht 72.0 in | Wt 194.0 lb

## 2024-03-28 DIAGNOSIS — J342 Deviated nasal septum: Secondary | ICD-10-CM

## 2024-03-28 DIAGNOSIS — J343 Hypertrophy of nasal turbinates: Secondary | ICD-10-CM

## 2024-03-28 DIAGNOSIS — H6123 Impacted cerumen, bilateral: Secondary | ICD-10-CM

## 2024-03-28 DIAGNOSIS — J310 Chronic rhinitis: Secondary | ICD-10-CM

## 2024-03-28 DIAGNOSIS — R0982 Postnasal drip: Secondary | ICD-10-CM

## 2024-03-28 NOTE — Progress Notes (Signed)
 Patient ID: [name], male   DOB: 12-Dec-1934, 89 y.o.   MRN: [id number]  Follow up: Chronic nasal congestion, chronic nasal drainage   History of Present Illness [name] is an 89 year old male who returns today for his follow-up evaluation.  The patient was previously seen for chronic nasal congestion and nasal drainage.  At his last visit, he was noted to have nasal septal deviation, bilateral inferior turbinate hypertrophy, and chronic postnasal drainage.  He was treated with Atrovent nasal spray and nasal saline irrigation.  He returns today complaining of persistent nasal drainage, which he manages with Atrovent nasal spray twice daily. This treatment has been effective in controlling the drainage. He notes that the allergy season caused only mild congestion.  He also has a history of bilateral stenotic ear canals and recurrent cerumen impaction.  Currently he denies any otalgia or otorrhea.  Exam: General: Communicates without difficulty, well nourished, no acute distress. Head: Normocephalic, no evidence injury, no tenderness, facial buttresses intact without stepoff. Face/sinus: No tenderness to palpation and percussion. Facial movement is normal and symmetric. Eyes: PERRL, EOMI. No scleral icterus, conjunctivae clear. Neuro: CN II exam reveals vision grossly intact.  No nystagmus at any point of gaze. Ears: Auricles well formed without lesions.  Bilateral cerumen impaction.  Nose: External evaluation reveals normal support and skin without lesions.  Dorsum is intact.  Anterior rhinoscopy reveals congested mucosa over anterior aspect of inferior turbinates and intact septum.  No purulence noted. Oral:  Oral cavity and oropharynx are intact, symmetric, without erythema or edema.  Mucosa is moist without lesions. Neck: Full range of motion without pain.  There is no significant lymphadenopathy.  No masses palpable.  Thyroid bed within normal limits to palpation.  Parotid glands and  submandibular glands equal bilaterally without mass.  Trachea is midline. Neuro:  CN 2-12 grossly intact.   Procedure: Bilateral cerumen disimpaction Anesthesia: None Description: Under the operating microscope, the cerumen is carefully removed with a combination of cerumen currette, alligator forceps, and suction catheters.  After the cerumen is removed, the TMs are noted to be normal.  No mass, erythema, or lesions. The patient tolerated the procedure well.    Assessment & Plan Bilateral recurrent cerumen impaction Recurrent cerumen impaction due to narrow ear canals, leading to frequent blockage.  - Otomicroscopy with bilateral cerumen disimpaction.  Chronic rhinitis with nasal mucosal congestion and postnasal drainage The patient is also noted to have nasal septal deviation and bilateral inferior turbinate hypertrophy.  Symptoms are well-managed with Atrovent nasal spray, used twice daily. No significant exacerbation during allergy season. - Continue Atrovent nasal spray once or twice daily as needed for symptom control - Nasal saline irrigation as needed - The patient will return for reevaluation in 3 months.

## 2024-06-26 ENCOUNTER — Ambulatory Visit (INDEPENDENT_AMBULATORY_CARE_PROVIDER_SITE_OTHER): Admitting: Otolaryngology

## 2024-07-12 ENCOUNTER — Other Ambulatory Visit

## 2024-07-19 ENCOUNTER — Ambulatory Visit: Admitting: Physician Assistant
